# Patient Record
Sex: Female | Born: 1975 | Race: White | Hispanic: No | Marital: Married | Smoking: Former smoker
Health system: Southern US, Community
[De-identification: ages and names within clinical notes are randomized; demographics above are authoritative.]

## PROBLEM LIST (undated history)

## (undated) DIAGNOSIS — T4145XA Adverse effect of unspecified anesthetic, initial encounter: Secondary | ICD-10-CM

## (undated) DIAGNOSIS — M1990 Unspecified osteoarthritis, unspecified site: Secondary | ICD-10-CM

## (undated) DIAGNOSIS — Z3490 Encounter for supervision of normal pregnancy, unspecified, unspecified trimester: Secondary | ICD-10-CM

## (undated) DIAGNOSIS — IMO0002 Reserved for concepts with insufficient information to code with codable children: Secondary | ICD-10-CM

## (undated) DIAGNOSIS — D649 Anemia, unspecified: Secondary | ICD-10-CM

## (undated) DIAGNOSIS — G43909 Migraine, unspecified, not intractable, without status migrainosus: Secondary | ICD-10-CM

## (undated) DIAGNOSIS — B999 Unspecified infectious disease: Secondary | ICD-10-CM

## (undated) DIAGNOSIS — O4402 Placenta previa specified as without hemorrhage, second trimester: Secondary | ICD-10-CM

## (undated) DIAGNOSIS — N83209 Unspecified ovarian cyst, unspecified side: Secondary | ICD-10-CM

## (undated) DIAGNOSIS — T8859XA Other complications of anesthesia, initial encounter: Secondary | ICD-10-CM

## (undated) HISTORY — PX: WRIST SURGERY: SHX841

## (undated) HISTORY — DX: Unspecified osteoarthritis, unspecified site: M19.90

## (undated) HISTORY — PX: KNEE SURGERY: SHX244

## (undated) HISTORY — DX: Unspecified infectious disease: B99.9

## (undated) HISTORY — DX: Encounter for supervision of normal pregnancy, unspecified, unspecified trimester: Z34.90

## (undated) HISTORY — DX: Unspecified ovarian cyst, unspecified side: N83.209

## (undated) HISTORY — DX: Adverse effect of unspecified anesthetic, initial encounter: T41.45XA

## (undated) HISTORY — PX: HERNIA REPAIR: SHX51

## (undated) HISTORY — DX: Reserved for concepts with insufficient information to code with codable children: IMO0002

## (undated) HISTORY — PX: CHOLECYSTECTOMY: SHX55

## (undated) HISTORY — DX: Anemia, unspecified: D64.9

## (undated) HISTORY — DX: Complete placenta previa nos or without hemorrhage, second trimester: O44.02

## (undated) HISTORY — DX: Other complications of anesthesia, initial encounter: T88.59XA

---
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
## 1998-01-04 ENCOUNTER — Emergency Department (HOSPITAL_COMMUNITY): Admission: EM | Admit: 1998-01-04 | Discharge: 1998-01-04 | Payer: Self-pay | Admitting: Emergency Medicine

## 1998-07-30 ENCOUNTER — Emergency Department (HOSPITAL_COMMUNITY): Admission: EM | Admit: 1998-07-30 | Discharge: 1998-07-30 | Payer: Self-pay | Admitting: Emergency Medicine

## 1998-08-06 ENCOUNTER — Emergency Department (HOSPITAL_COMMUNITY): Admission: EM | Admit: 1998-08-06 | Discharge: 1998-08-06 | Payer: Self-pay | Admitting: Emergency Medicine

## 1998-08-06 ENCOUNTER — Encounter: Payer: Self-pay | Admitting: Emergency Medicine

## 1998-09-18 ENCOUNTER — Encounter: Admission: RE | Admit: 1998-09-18 | Discharge: 1998-09-18 | Payer: Self-pay | Admitting: Family Medicine

## 1998-10-02 ENCOUNTER — Encounter: Payer: Self-pay | Admitting: Family Medicine

## 1998-10-02 ENCOUNTER — Ambulatory Visit (HOSPITAL_COMMUNITY): Admission: RE | Admit: 1998-10-02 | Discharge: 1998-10-02 | Payer: Self-pay | Admitting: Family Medicine

## 1999-02-20 ENCOUNTER — Ambulatory Visit (HOSPITAL_BASED_OUTPATIENT_CLINIC_OR_DEPARTMENT_OTHER): Admission: RE | Admit: 1999-02-20 | Discharge: 1999-02-20 | Payer: Self-pay | Admitting: Orthopedic Surgery

## 1999-03-06 ENCOUNTER — Encounter: Payer: Self-pay | Admitting: Emergency Medicine

## 1999-03-06 ENCOUNTER — Emergency Department (HOSPITAL_COMMUNITY): Admission: EM | Admit: 1999-03-06 | Discharge: 1999-03-06 | Payer: Self-pay | Admitting: Emergency Medicine

## 1999-06-23 DIAGNOSIS — R87619 Unspecified abnormal cytological findings in specimens from cervix uteri: Secondary | ICD-10-CM

## 1999-06-23 DIAGNOSIS — IMO0002 Reserved for concepts with insufficient information to code with codable children: Secondary | ICD-10-CM

## 1999-06-23 HISTORY — DX: Reserved for concepts with insufficient information to code with codable children: IMO0002

## 1999-06-23 HISTORY — DX: Unspecified abnormal cytological findings in specimens from cervix uteri: R87.619

## 1999-07-13 ENCOUNTER — Encounter: Payer: Self-pay | Admitting: *Deleted

## 1999-07-13 ENCOUNTER — Emergency Department (HOSPITAL_COMMUNITY): Admission: EM | Admit: 1999-07-13 | Discharge: 1999-07-13 | Payer: Self-pay | Admitting: *Deleted

## 1999-12-13 ENCOUNTER — Inpatient Hospital Stay (HOSPITAL_COMMUNITY): Admission: AD | Admit: 1999-12-13 | Discharge: 1999-12-13 | Payer: Self-pay | Admitting: *Deleted

## 2000-10-08 ENCOUNTER — Emergency Department (HOSPITAL_COMMUNITY): Admission: EM | Admit: 2000-10-08 | Discharge: 2000-10-08 | Payer: Self-pay | Admitting: Emergency Medicine

## 2000-10-08 ENCOUNTER — Encounter: Payer: Self-pay | Admitting: Emergency Medicine

## 2000-10-15 ENCOUNTER — Emergency Department (HOSPITAL_COMMUNITY): Admission: EM | Admit: 2000-10-15 | Discharge: 2000-10-15 | Payer: Self-pay | Admitting: Emergency Medicine

## 2000-10-15 ENCOUNTER — Encounter: Payer: Self-pay | Admitting: Emergency Medicine

## 2001-05-18 ENCOUNTER — Emergency Department (HOSPITAL_COMMUNITY): Admission: EM | Admit: 2001-05-18 | Discharge: 2001-05-18 | Payer: Self-pay

## 2001-06-02 ENCOUNTER — Ambulatory Visit (HOSPITAL_COMMUNITY): Admission: RE | Admit: 2001-06-02 | Discharge: 2001-06-02 | Payer: Self-pay | Admitting: Orthopaedic Surgery

## 2001-06-02 ENCOUNTER — Encounter: Payer: Self-pay | Admitting: Orthopaedic Surgery

## 2001-12-17 ENCOUNTER — Emergency Department (HOSPITAL_COMMUNITY): Admission: EM | Admit: 2001-12-17 | Discharge: 2001-12-17 | Payer: Self-pay

## 2002-02-09 ENCOUNTER — Encounter: Payer: Self-pay | Admitting: Emergency Medicine

## 2002-02-09 ENCOUNTER — Emergency Department (HOSPITAL_COMMUNITY): Admission: EM | Admit: 2002-02-09 | Discharge: 2002-02-09 | Payer: Self-pay | Admitting: Emergency Medicine

## 2002-04-16 ENCOUNTER — Emergency Department (HOSPITAL_COMMUNITY): Admission: EM | Admit: 2002-04-16 | Discharge: 2002-04-16 | Payer: Self-pay

## 2003-06-23 HISTORY — PX: CHOLECYSTECTOMY: SHX55

## 2003-06-23 HISTORY — PX: DILATION AND CURETTAGE OF UTERUS: SHX78

## 2004-05-20 ENCOUNTER — Inpatient Hospital Stay (HOSPITAL_COMMUNITY): Admission: AD | Admit: 2004-05-20 | Discharge: 2004-05-21 | Payer: Self-pay | Admitting: Obstetrics & Gynecology

## 2004-06-20 ENCOUNTER — Ambulatory Visit (HOSPITAL_COMMUNITY): Admission: RE | Admit: 2004-06-20 | Discharge: 2004-06-20 | Payer: Self-pay | Admitting: Obstetrics & Gynecology

## 2004-06-22 DIAGNOSIS — O4402 Placenta previa specified as without hemorrhage, second trimester: Secondary | ICD-10-CM

## 2004-06-22 HISTORY — DX: Complete placenta previa nos or without hemorrhage, second trimester: O44.02

## 2004-09-19 ENCOUNTER — Observation Stay (HOSPITAL_COMMUNITY): Admission: AD | Admit: 2004-09-19 | Discharge: 2004-09-20 | Payer: Self-pay | Admitting: *Deleted

## 2004-10-09 ENCOUNTER — Observation Stay (HOSPITAL_COMMUNITY): Admission: AD | Admit: 2004-10-09 | Discharge: 2004-10-10 | Payer: Self-pay | Admitting: Obstetrics & Gynecology

## 2004-10-11 ENCOUNTER — Inpatient Hospital Stay (HOSPITAL_COMMUNITY): Admission: AD | Admit: 2004-10-11 | Discharge: 2004-10-11 | Payer: Self-pay | Admitting: Obstetrics

## 2004-10-17 ENCOUNTER — Inpatient Hospital Stay (HOSPITAL_COMMUNITY): Admission: AD | Admit: 2004-10-17 | Discharge: 2004-10-17 | Payer: Self-pay | Admitting: Obstetrics & Gynecology

## 2004-10-19 ENCOUNTER — Inpatient Hospital Stay (HOSPITAL_COMMUNITY): Admission: AD | Admit: 2004-10-19 | Discharge: 2004-10-20 | Payer: Self-pay | Admitting: Obstetrics

## 2004-10-21 ENCOUNTER — Inpatient Hospital Stay (HOSPITAL_COMMUNITY): Admission: AD | Admit: 2004-10-21 | Discharge: 2004-10-24 | Payer: Self-pay | Admitting: Obstetrics

## 2004-10-22 ENCOUNTER — Encounter (INDEPENDENT_AMBULATORY_CARE_PROVIDER_SITE_OTHER): Payer: Self-pay | Admitting: *Deleted

## 2004-10-29 ENCOUNTER — Ambulatory Visit (HOSPITAL_COMMUNITY): Admission: RE | Admit: 2004-10-29 | Discharge: 2004-10-29 | Payer: Self-pay | Admitting: Obstetrics & Gynecology

## 2005-03-23 ENCOUNTER — Ambulatory Visit (HOSPITAL_COMMUNITY): Admission: RE | Admit: 2005-03-23 | Discharge: 2005-03-23 | Payer: Self-pay | Admitting: Surgery

## 2005-03-23 ENCOUNTER — Ambulatory Visit (HOSPITAL_BASED_OUTPATIENT_CLINIC_OR_DEPARTMENT_OTHER): Admission: RE | Admit: 2005-03-23 | Discharge: 2005-03-23 | Payer: Self-pay | Admitting: Surgery

## 2005-03-23 ENCOUNTER — Encounter (INDEPENDENT_AMBULATORY_CARE_PROVIDER_SITE_OTHER): Payer: Self-pay | Admitting: Specialist

## 2005-09-03 ENCOUNTER — Encounter
Admission: RE | Admit: 2005-09-03 | Discharge: 2005-10-05 | Payer: Self-pay | Admitting: Physical Medicine & Rehabilitation

## 2005-12-10 ENCOUNTER — Emergency Department (HOSPITAL_COMMUNITY): Admission: EM | Admit: 2005-12-10 | Discharge: 2005-12-10 | Payer: Self-pay | Admitting: Emergency Medicine

## 2006-04-27 ENCOUNTER — Emergency Department (HOSPITAL_COMMUNITY): Admission: EM | Admit: 2006-04-27 | Discharge: 2006-04-27 | Payer: Self-pay | Admitting: Emergency Medicine

## 2006-05-17 ENCOUNTER — Emergency Department (HOSPITAL_COMMUNITY): Admission: EM | Admit: 2006-05-17 | Discharge: 2006-05-18 | Payer: Self-pay | Admitting: Emergency Medicine

## 2006-08-03 ENCOUNTER — Emergency Department (HOSPITAL_COMMUNITY): Admission: EM | Admit: 2006-08-03 | Discharge: 2006-08-03 | Payer: Self-pay | Admitting: Emergency Medicine

## 2006-10-16 ENCOUNTER — Emergency Department (HOSPITAL_COMMUNITY): Admission: EM | Admit: 2006-10-16 | Discharge: 2006-10-16 | Payer: Self-pay | Admitting: *Deleted

## 2007-04-21 ENCOUNTER — Emergency Department (HOSPITAL_COMMUNITY): Admission: EM | Admit: 2007-04-21 | Discharge: 2007-04-21 | Payer: Self-pay | Admitting: Emergency Medicine

## 2008-03-10 ENCOUNTER — Emergency Department (HOSPITAL_COMMUNITY): Admission: EM | Admit: 2008-03-10 | Discharge: 2008-03-10 | Payer: Self-pay | Admitting: Emergency Medicine

## 2008-04-29 ENCOUNTER — Emergency Department (HOSPITAL_COMMUNITY): Admission: EM | Admit: 2008-04-29 | Discharge: 2008-04-29 | Payer: Self-pay | Admitting: Emergency Medicine

## 2008-08-30 ENCOUNTER — Encounter: Admission: RE | Admit: 2008-08-30 | Discharge: 2008-09-20 | Payer: Self-pay | Admitting: Orthopedic Surgery

## 2008-09-19 ENCOUNTER — Emergency Department (HOSPITAL_COMMUNITY): Admission: EM | Admit: 2008-09-19 | Discharge: 2008-09-19 | Payer: Self-pay | Admitting: Family Medicine

## 2009-06-20 ENCOUNTER — Emergency Department (HOSPITAL_COMMUNITY): Admission: EM | Admit: 2009-06-20 | Discharge: 2009-06-20 | Payer: Self-pay | Admitting: Emergency Medicine

## 2009-11-24 ENCOUNTER — Emergency Department (HOSPITAL_BASED_OUTPATIENT_CLINIC_OR_DEPARTMENT_OTHER): Admission: EM | Admit: 2009-11-24 | Discharge: 2009-11-25 | Payer: Self-pay | Admitting: Emergency Medicine

## 2009-11-29 ENCOUNTER — Emergency Department (HOSPITAL_BASED_OUTPATIENT_CLINIC_OR_DEPARTMENT_OTHER): Admission: EM | Admit: 2009-11-29 | Discharge: 2009-11-30 | Payer: Self-pay | Admitting: Emergency Medicine

## 2010-02-02 ENCOUNTER — Emergency Department (HOSPITAL_BASED_OUTPATIENT_CLINIC_OR_DEPARTMENT_OTHER): Admission: EM | Admit: 2010-02-02 | Discharge: 2010-02-03 | Payer: Self-pay | Admitting: Emergency Medicine

## 2010-02-02 ENCOUNTER — Ambulatory Visit: Payer: Self-pay | Admitting: Diagnostic Radiology

## 2010-07-13 ENCOUNTER — Emergency Department (HOSPITAL_BASED_OUTPATIENT_CLINIC_OR_DEPARTMENT_OTHER)
Admission: EM | Admit: 2010-07-13 | Discharge: 2010-07-13 | Payer: Self-pay | Source: Home / Self Care | Admitting: Emergency Medicine

## 2010-09-04 ENCOUNTER — Emergency Department (HOSPITAL_BASED_OUTPATIENT_CLINIC_OR_DEPARTMENT_OTHER)
Admission: EM | Admit: 2010-09-04 | Discharge: 2010-09-04 | Disposition: A | Discharge status: Home / Self Care | Payer: Medicaid Other | Attending: Emergency Medicine | Admitting: Emergency Medicine

## 2010-09-04 DIAGNOSIS — R059 Cough, unspecified: Secondary | ICD-10-CM | POA: Insufficient documentation

## 2010-09-04 DIAGNOSIS — J45909 Unspecified asthma, uncomplicated: Secondary | ICD-10-CM | POA: Insufficient documentation

## 2010-09-04 DIAGNOSIS — R05 Cough: Secondary | ICD-10-CM | POA: Insufficient documentation

## 2010-09-04 DIAGNOSIS — J40 Bronchitis, not specified as acute or chronic: Secondary | ICD-10-CM | POA: Insufficient documentation

## 2010-11-07 NOTE — Discharge Summary (Signed)
NAMEJONICE, CERRA            ACCOUNT NO.:  [id number]   MEDICAL RECORD NO.:  [id number]          PATIENT TYPE:  INP   LOCATION:  9155                          FACILITY:  [facility]   PHYSICIAN:  Charles A. Clearance Coots, M.D.DATE OF BIRTH:  1975/10/26   DATE OF ADMISSION:  10/09/2004  DATE OF DISCHARGE:  10/10/2004                                 DISCHARGE SUMMARY   ADMITTING DIAGNOSES:  1.  Thirty-four weeks gestation  2.  Marginal placental separation.   DISCHARGE DIAGNOSES:  1.  Thirty-three weeks gestation.  2.  Marginal placental separation, much improved after bedrest. Discharged      home undelivered at [redacted] weeks gestation.   REASON FOR ADMISSION:  A 35 year old white female G4 P1-0-2-1 with an  estimated date of confinement of Nov 11, 2004 presents with vaginal  bleeding. The patient had had a previous history of questionable marginal  placenta previa and on follow-up ultrasounds there was no evidence of  placenta previa. The patient presented to [facility] triage with  cramping and vaginal bleeding, was admitted to the hospital for observation.   PAST MEDICAL HISTORY:  Surgery:  Appendectomy, cholecystectomy. Illnesses:  Asthma, headaches.   MEDICATIONS:  Prenatal vitamins, terbutaline, Ambien, and Lunesta.   ALLERGIES:  AUGMENTIN.   SOCIAL HISTORY:  Single. Occasional tobacco. Negative alcohol or  recreational drug use.   PHYSICAL EXAMINATION:  GENERAL:  Well-nourished, well-developed white female  in no acute distress.  VITAL SIGNS:  Temperature 98.2, pulse 80, respiratory rate 16, blood  pressure 128/84. Height 5 feet 0 inches, weight 150 pounds.  LUNGS:  Clear to auscultation bilaterally.  HEART:  Regular rate and rhythm.  ABDOMEN:  Gravid, nontender.  PELVIC:  On speculum exam, moderate amount of blood in vaginal vault. Cervix  appeared long and closed.   ADMITTING LABORATORY VALUES:  Hemoglobin 11; hematocrit 33; white blood cell  count 10,500; platelets  311,000. Fibrinogen 531. INR 1.0.   HOSPITAL COURSE:  The patient was admitted and had no further vaginal  bleeding, was therefore discharged home after 24 hours of observation.   DISCHARGE DISPOSITION:  1.  Medications:  Continue prenatal vitamins.  2.  Routine written instructions were given for obstetrical discharge      undelivered.  3.  The patient is to follow up in the office in 1 week.      CAH/MEDQ  D:  10/10/2004  T:  10/10/2004  Job:  [id number]

## 2010-11-07 NOTE — Op Note (Signed)
NAMESHANEY, DECKMAN            ACCOUNT NO.:  [id number]   MEDICAL RECORD NO.:  [id number]          PATIENT TYPE:  AMB   LOCATION:  DSC                          FACILITY:  [facility]   PHYSICIAN:  Wilmon Arms. Corliss Skains, M.D. DATE OF BIRTH:  27-Nov-1975   DATE OF PROCEDURE:  03/23/2005  DATE OF DISCHARGE:                                 OPERATIVE REPORT   PREOPERATIVE DIAGNOSES:  1.  Anal fistula.  2.  External hemorrhoids.   POSTOPERATIVE DIAGNOSES:  1.  Anal fistula.  2.  External hemorrhoids.   OPERATION PERFORMED:  1.  Anal fistulotomy.  2.  External hemorrhoidectomy.   SURGEON:  Wilmon Arms. Tsuei, M.D.   ANESTHESIA:  General endotracheal.   INDICATIONS FOR PROCEDURE:  The patient is a 35 year old female who  presented to the office a couple of weeks ago with hemorrhoid disease.  She  was noted to have external hemorrhoid.  She also had what appeared to be a  spontaneously drained perirectal abscess at the base of the anterior  hemorrhoid.  This was treated conservatively with sitz baths and stool  softeners. However, this did not resolve the problem and the decision was  made to proceed with surgical intervention.   DESCRIPTION OF PROCEDURE:  The patient was brought to the operating room  where general endotracheal anesthesia was obtained.  She was rotated to a  prone position with appropriate padding.  Her buttocks were taped apart.  The perianal region was prepped with Betadine and draped in sterile fashion.  A time out was then taken to ensure the proper procedure and the proper  patient.  The rectal retractor was then inserted.  A fistula opening was  noted just distal to the dentate line.  A small lacrimal probe was placed  into this fistula opening.  This tracked superiorly approximately 1.5 cm  under the skin and mucosa.  The probe was brought out through the internal  opening and a fistulotomy was then created using a Bovie cautery.  The  sphincter muscle was  visualized and was noted to be intact.  Once the  fistulotomy was completed, a probe was used to explore the remainder of the  abscess cavity. The abscess cavity appeared to track slightly anterior but  did not communicate with the posterior vaginal wall.  Hemostasis was  obtained with Bovie cautery.  This area was thoroughly irrigated.  We then  turned our attention to the external hemorrhoid.  The apex of the hemorrhoid  was ligated with a 3-0 Vicryl suture.  The knife was used to score the skin  and mucosa around the base of the hemorrhoid.  Metzenbaum scissors were then  used to dissect the hemorrhoid off of the sphincter muscle.  Hemostasis was  obtained with Bovie cautery.  The wound was then closed most of the way with  a locking running 3-0 Vicryl suture.  The most distal 0.5 cm was left open  to allow  drainage.  The wound was then thoroughly examined.  No further fistulae were  noted.  A large piece of Gelfoam coated with lidocaine jelly was then  inserted into the rectum and the retractors were removed.  The patient was  rolled back to a supine position, extubated and brought to recovery room in  stable condition.      Wilmon Arms. Tsuei, M.D.  Electronically Signed     MKT/MEDQ  D:  03/23/2005  T:  03/23/2005  Job:  [id number]

## 2011-02-20 ENCOUNTER — Emergency Department (INDEPENDENT_AMBULATORY_CARE_PROVIDER_SITE_OTHER): Payer: Medicaid Other

## 2011-02-20 ENCOUNTER — Encounter: Payer: Self-pay | Admitting: *Deleted

## 2011-02-20 ENCOUNTER — Emergency Department (HOSPITAL_BASED_OUTPATIENT_CLINIC_OR_DEPARTMENT_OTHER)
Admission: EM | Admit: 2011-02-20 | Discharge: 2011-02-20 | Disposition: A | Discharge status: Home / Self Care | Payer: Medicaid Other | Attending: Emergency Medicine | Admitting: Emergency Medicine

## 2011-02-20 DIAGNOSIS — R0789 Other chest pain: Secondary | ICD-10-CM

## 2011-02-20 DIAGNOSIS — F17200 Nicotine dependence, unspecified, uncomplicated: Secondary | ICD-10-CM | POA: Insufficient documentation

## 2011-02-20 DIAGNOSIS — R079 Chest pain, unspecified: Secondary | ICD-10-CM

## 2011-02-20 DIAGNOSIS — D72829 Elevated white blood cell count, unspecified: Secondary | ICD-10-CM

## 2011-02-20 DIAGNOSIS — R6884 Jaw pain: Secondary | ICD-10-CM

## 2011-02-20 HISTORY — DX: Migraine, unspecified, not intractable, without status migrainosus: G43.909

## 2011-02-20 LAB — BASIC METABOLIC PANEL
CO2: 25 mEq/L (ref 19–32)
Calcium: 9 mg/dL (ref 8.4–10.5)
Chloride: 102 mEq/L (ref 96–112)
GFR calc non Af Amer: >60 mL/min (ref >60)

## 2011-02-20 LAB — CBC
HCT: 40.6 % (ref 36.0–46.0)
Hemoglobin: 13 g/dL (ref 12.0–15.0)
MCH: 26.4 pg (ref 26.0–34.0)
MCHC: 32 g/dL (ref 30.0–36.0)
MCV: 82.4 fL (ref 78.0–100.0)
Platelets: 314 10*3/uL (ref 150–400)
RBC: 4.93 MIL/uL (ref 3.87–5.11)
RDW: 14.8 % (ref 11.5–15.5)
WBC: 13.1 10*3/uL — ABNORMAL HIGH (ref 4.0–10.5)

## 2011-02-20 LAB — TROPONIN I: Troponin I: <0.3 ng/mL (ref <0.30)

## 2011-02-20 MED ORDER — DIPHENHYDRAMINE HCL 50 MG/ML IJ SOLN
25.0000 mg | Freq: Once | INTRAMUSCULAR | Status: AC
Start: 2011-02-20 — End: 2011-02-20
  Administered 2011-02-20: 25 mg via INTRAVENOUS
  Filled 2011-02-20: qty 1

## 2011-02-20 MED ORDER — KETOROLAC TROMETHAMINE 30 MG/ML IJ SOLN
30.0000 mg | Freq: Once | INTRAMUSCULAR | Status: AC
  Administered 2011-02-20: 30 mg via INTRAVENOUS
  Filled 2011-02-20: qty 1

## 2011-02-20 MED ORDER — OXYCODONE-ACETAMINOPHEN 5-325 MG PO TABS: 2.0000 | ORAL_TABLET | ORAL | Status: AC | PRN

## 2011-02-20 MED ORDER — METOCLOPRAMIDE HCL 5 MG/ML IJ SOLN
10.0000 mg | Freq: Once | INTRAMUSCULAR | Status: AC
  Administered 2011-02-20: 10 mg via INTRAVENOUS
  Filled 2011-02-20: qty 2

## 2011-02-20 MED ORDER — GI COCKTAIL ~~LOC~~
30.0000 mL | Freq: Once | ORAL | Status: AC
  Administered 2011-02-20: 30 mL via ORAL
  Filled 2011-02-20: qty 30

## 2011-02-20 MED ORDER — LANSOPRAZOLE 15 MG PO CPDR: 15.0000 mg | DELAYED_RELEASE_CAPSULE | Freq: Every day | ORAL | Status: DC

## 2011-02-20 NOTE — ED Notes (Signed)
Pt requesting pain/nausea medicines for her migraine. EDP made aware.

## 2011-02-20 NOTE — ED Notes (Signed)
Pt reports her migraine has decreased to 0/10, however chest/jaw pain remain 8/10. Will notify EDP

## 2011-02-20 NOTE — ED Provider Notes (Addendum)
History     CSN: [id number] Arrival date & time: 02/20/2011 12:19 AM  Chief Complaint  Patient presents with  . Chest Pain   HPI  35 year old female presents to emergency room with complaint of chest pain and jaw pain. Patient reports onset of left-sided pain that radiates to her back and into her right neck and jaw starting on Sunday, 5 days ago. Patient reports pain initially brief lasting a lengthy seconds at a time, then yesterday lasting 25 minutes, and then today throughout the day. Patient reports pain is slightly improved with sticking her tongue out at times. Patient also complaining of her typical migraine, she's been feeling hot without having a fever, she has been tired sleeping most of the last 2 days, and has been nauseated. Patient reports she smokes a quarter pack of cigarettes a day. She has had no swelling in her lower extremities, she is not on birth control pills, has had no prior history of DVT or PE. Patient reports she recently finished course of amoxicillin for right ear infection. No prior history of cardiac disease. Patient is followed by Dr. Piedad Climes. Patient has taken times without relief. She is also taking her Inderal for migraine without relief.  Past Medical History  Diagnosis Date  . Migraines     Past Surgical History  Procedure Date  . Appendectomy   . Cholecystectomy     No family history on file.  History  Substance Use Topics  . Smoking status: Current Everyday Smoker  . Smokeless tobacco: Not on file  . Alcohol Use: Yes     rarely    OB History    Grav Para Term Preterm Abortions TAB SAB Ect Mult Living                  Review of Systems  Review of systems negative other than documented in history of present illness  Physical Exam  BP 122/65  Pulse 72  Temp(Src) 99.2 F (37.3 C) (Oral)  Resp 16  SpO2 98%  LMP 02/05/2011  Physical Exam  Constitutional: She is oriented to person, place, and time. She appears well-developed and  well-nourished.       Obese, uncomfortable appearing  HENT:  Head: Normocephalic and atraumatic.  Right Ear: External ear normal.  Left Ear: External ear normal.  Nose: Nose normal.  Mouth/Throat: Oropharynx is clear and moist.       Slight soft tissue swelling along the right jaw compared to left, tenderness with palpation over right maxilla and mandible. No appreciative dental disease on intraoral exam. No rashes erythema induration  Eyes: Conjunctivae and EOM are normal. Pupils are equal, round, and reactive to light. Right eye exhibits no discharge. No scleral icterus.  Neck: Normal range of motion. Neck supple. No JVD present. No tracheal deviation present. No thyromegaly present.       Patient with pain with palpation over right sternocleidomastoid muscle, no preauricular or postauricular pain noted.  Cardiovascular: Normal rate, regular rhythm, normal heart sounds and intact distal pulses.  Exam reveals no gallop and no friction rub.   No murmur heard. Pulmonary/Chest: Effort normal and breath sounds normal. No stridor. No respiratory distress. She has no wheezes. She has no rales. She exhibits tenderness.       Patient with mild tenderness with palpation of left chest, patient unable to determine if it is the same pain that she presented with today  Abdominal: Soft. Bowel sounds are normal. She exhibits no distension and no  mass. There is no tenderness. There is no rebound and no guarding.  Musculoskeletal: Normal range of motion. She exhibits no edema and no tenderness.  Lymphadenopathy:    She has no cervical adenopathy.  Neurological: She is alert and oriented to person, place, and time. She has normal reflexes. She displays normal reflexes. No cranial nerve deficit. She exhibits normal muscle tone. Coordination normal.  Skin: Skin is warm and dry. No rash noted. No erythema. No pallor.    ED Course  Procedures Results for orders placed during the hospital encounter of 02/20/11    CBC      Component Value Range   WBC 13.1 (*) 4.0 - 10.5 (K/uL)   RBC 4.93  3.87 - 5.11 (MIL/uL)   Hemoglobin 13.0  12.0 - 15.0 (g/dL)   HCT 62.1  30.8 - 65.7 (%)   MCV 82.4  78.0 - 100.0 (fL)   MCH 26.4  26.0 - 34.0 (pg)   MCHC 32.0  30.0 - 36.0 (g/dL)   RDW 84.6  96.2 - 95.2 (%)   Platelets 314  150 - 400 (K/uL)  BASIC METABOLIC PANEL      Component Value Range   Sodium 138  135 - 145 (mEq/L)   Potassium 3.6  3.5 - 5.1 (mEq/L)   Chloride 102  96 - 112 (mEq/L)   CO2 25  19 - 32 (mEq/L)   Glucose, Bld 99  70 - 99 (mg/dL)   BUN 14  6 - 23 (mg/dL)   Creatinine, Ser 8.41  0.50 - 1.10 (mg/dL)   Calcium 9.0  8.4 - 32.4 (mg/dL)   GFR calc non Af Amer >60  >60 (mL/min)   GFR calc Af Amer >60  >60 (mL/min)  D-DIMER, QUANTITATIVE      Component Value Range   D-Dimer, Quant 0.32  0.00 - 0.48 (ug/mL-FEU)  TROPONIN I      Component Value Range   Troponin I <0.30  <0.30 (ng/mL)   Dg Chest 2 View  02/20/2011  *RADIOLOGY REPORT*  Clinical Data: Midsternal chest pain  CHEST - 2 VIEW  Comparison: None.  Findings: Cardiomediastinal silhouette is within normal limits. The lungs are clear. No pleural effusion.  No pneumothorax.  No acute osseous abnormality. Cholecystectomy clips noted.  IMPRESSION: Normal exam.  Original Report Authenticated By: Harrel Lemon, M.D.    Date: 02/20/2011  Rate: 78  Rhythm: normal sinus rhythm  QRS Axis: normal  Intervals: normal  ST/T Wave abnormalities: normal  Conduction Disutrbances:none  Narrative Interpretation: normal ekg  Old EKG Reviewed: none available  MDM 35 year old female with left-sided chest pain and right jaw and neck pain. Differential included cardiac ischemia, pericarditis, aortic dissection, PE, ear infection, mastoiditis. Patient noted to have leukocytosis otherwise workup has been unremarkable. Patient with resolution of migraine with normal cocktail of medications. She reports chest pain improved after GI cocktail. No acute emergent  conditions identified at this time. Will have patient followup with her primary care doctor for further workup of her pain and leukocytosis.       Olivia Mackie, MD 02/20/11 4010  Olivia Mackie, MD 02/20/11 [phone]

## 2011-02-20 NOTE — ED Notes (Signed)
C/o left sided chest pains since last week, radiates into left jaw and straight through into back. Intermittent in nature. Described as sharp and stabbing. +nausea, SOB and diaphoresis

## 2011-03-07 ENCOUNTER — Emergency Department (HOSPITAL_BASED_OUTPATIENT_CLINIC_OR_DEPARTMENT_OTHER)
Admission: EM | Admit: 2011-03-07 | Discharge: 2011-03-07 | Disposition: A | Discharge status: Home / Self Care | Payer: Medicaid Other | Attending: Emergency Medicine | Admitting: Emergency Medicine

## 2011-03-07 ENCOUNTER — Encounter (HOSPITAL_BASED_OUTPATIENT_CLINIC_OR_DEPARTMENT_OTHER): Payer: Self-pay | Admitting: Emergency Medicine

## 2011-03-07 DIAGNOSIS — J45901 Unspecified asthma with (acute) exacerbation: Secondary | ICD-10-CM

## 2011-03-07 DIAGNOSIS — J069 Acute upper respiratory infection, unspecified: Secondary | ICD-10-CM | POA: Insufficient documentation

## 2011-03-07 DIAGNOSIS — F17200 Nicotine dependence, unspecified, uncomplicated: Secondary | ICD-10-CM | POA: Insufficient documentation

## 2011-03-07 MED ORDER — ALBUTEROL SULFATE (5 MG/ML) 0.5% IN NEBU
2.5000 mg | INHALATION_SOLUTION | Freq: Once | RESPIRATORY_TRACT | Status: AC
  Administered 2011-03-07: 2.5 mg via RESPIRATORY_TRACT
  Filled 2011-03-07: qty 1

## 2011-03-07 MED ORDER — DESLORATADINE-PSEUDOEPHED ER 5-240 MG PO TB24: 1.0000 | ORAL_TABLET | Freq: Every day | ORAL | Status: DC

## 2011-03-07 MED ORDER — ALBUTEROL SULFATE HFA 108 (90 BASE) MCG/ACT IN AERS
INHALATION_SPRAY | RESPIRATORY_TRACT | Status: AC
  Administered 2011-03-07: 21:00:00
  Filled 2011-03-07: qty 6.7

## 2011-03-07 MED ORDER — ALBUTEROL SULFATE (5 MG/ML) 0.5% IN NEBU
INHALATION_SOLUTION | RESPIRATORY_TRACT | Status: AC
  Administered 2011-03-07: 5 mg
  Filled 2011-03-07: qty 1

## 2011-03-07 MED ORDER — IPRATROPIUM BROMIDE 0.02 % IN SOLN
0.5000 mg | Freq: Once | RESPIRATORY_TRACT | Status: AC
  Administered 2011-03-07: 0.5 mg via RESPIRATORY_TRACT
  Filled 2011-03-07: qty 2.5

## 2011-03-07 MED ORDER — PREDNISONE 10 MG PO TABS: 40.0000 mg | ORAL_TABLET | Freq: Every day | ORAL | Status: AC

## 2011-03-07 MED ORDER — IPRATROPIUM BROMIDE 0.02 % IN SOLN
RESPIRATORY_TRACT | Status: AC
  Administered 2011-03-07: 0.5 mg
  Filled 2011-03-07: qty 2.5

## 2011-03-07 MED ORDER — ALBUTEROL SULFATE HFA 108 (90 BASE) MCG/ACT IN AERS
2.0000 | INHALATION_SPRAY | RESPIRATORY_TRACT | Status: DC
  Administered 2011-03-07: 2 via RESPIRATORY_TRACT

## 2011-03-07 MED ORDER — PREDNISONE 20 MG PO TABS
60.0000 mg | ORAL_TABLET | Freq: Once | ORAL | Status: AC
  Administered 2011-03-07: 60 mg via ORAL
  Filled 2011-03-07: qty 3

## 2011-03-07 NOTE — ED Provider Notes (Signed)
Scribed for Sarah Bailiff, MD, the patient was seen in room MHT14/MHT14 . This chart was scribed by Ellie Lunch. This patient's care was started at 9:07 PM.   CSN: [id number] Arrival date & time: 03/07/2011  6:39 PM   Chief Complaint  Patient presents with  . Asthma    (Include location/radiation/quality/duration/timing/severity/associated sxs/prior treatment) HPI Sarah Franklin is a 35 y.o. female with a history of asthma presents to the Emergency Department complaining of SOB. Pt reports she began to feel ill one day ago with chest congestion, cough, fever, ear pain, and SOB. Pt reports this morning her sx became progressively worse and she became SOB with associated wheezing. Pt's albuterol inhaler at home was empty and she was unable to treat her SOB.There are no other associated symptoms and no other alleviating or aggravating factors.   HPI ELEMENTS:  Onset: 1 day ago  Timing: constant  Modifying factors: improved with breathing treatment  Context: as above  Associated symptoms: wheezing  Past Medical History  Diagnosis Date  . Migraines   . Asthma      Past Surgical History  Procedure Date  . Appendectomy   . Cholecystectomy     No family history on file.  History  Substance Use Topics  . Smoking status: Current Everyday Smoker  . Smokeless tobacco: Not on file  . Alcohol Use: Yes     rarely   Review of Systems 10 Systems reviewed and are negative for acute change except as noted in the HPI.  Allergies  Mustard; Other; Topamax; Augmentin; and Eggs or egg-derived products  Home Medications   Current Outpatient Rx  Name Route Sig Dispense Refill  . ALBUTEROL SULFATE HFA 108 (90 BASE) MCG/ACT IN AERS Inhalation Inhale 2 puffs into the lungs every 6 (six) hours as needed. Shortness of breath and wheezing     . OVER THE COUNTER MEDICATION Oral Take 2 capsules by mouth 4 (four) times daily. CTS 360 for weightloss     . PROPRANOLOL HCL 80 MG PO TABS Oral Take  80 mg by mouth daily as needed. Migraine     . DESLORATADINE-PSEUDOEPHEDRINE 5-240 MG PO TB24 Oral Take 1 tablet by mouth daily. 30 tablet 0  . LANSOPRAZOLE 15 MG PO CPDR Oral Take 1 capsule (15 mg total) by mouth daily. 20 capsule 0  . PREDNISONE 10 MG PO TABS Oral Take 4 tablets (40 mg total) by mouth daily. 20 tablet 0    Physical Exam    BP 140/83  Pulse 102  Temp(Src) 99.4 F (37.4 C) (Oral)  Resp 20  SpO2 99%  LMP 03/06/2011  Physical Exam  Nursing note and vitals reviewed. Constitutional: She is oriented to person, place, and time. She appears well-developed and well-nourished.  HENT:  Head: Normocephalic and atraumatic.       Fluid behind right ear  Eyes: Conjunctivae and EOM are normal. Pupils are equal, round, and reactive to light.  Neck: Normal range of motion. Neck supple.  Cardiovascular: Regular rhythm and normal heart sounds.   Pulmonary/Chest: Effort normal. She has wheezes (faint).  Abdominal: Soft. Bowel sounds are normal.  Musculoskeletal: Normal range of motion.  Neurological: She is alert and oriented to person, place, and time.  Skin: Skin is warm and dry.  Psychiatric: She has a normal mood and affect.   Procedures  OTHER DATA REVIEWED: Nursing notes, vital signs, and past medical records reviewed.  DIAGNOSTIC STUDIES: Oxygen Saturation is 99% on room air, normal by my interpretation.  ED COURSE / COORDINATION OF CARE: 19:45 EDP at Pt bedside. Pt reports improvement after 1st breathing treatment. Pt still has faint wheezing. 2nd breathing treatment ordered. Plan to discharge with albuterol inhaler conditional on Pt improvement   EDP ordered the following:  Medications  albuterol (PROVENTIL) (5 MG/ML) 0.5% nebulizer solution (5 mg  Given 03/07/11 1850)  ipratropium (ATROVENT) 0.02 % nebulizer solution (0.5 mg  Given 03/07/11 1850)  albuterol (PROVENTIL) (5 MG/ML) 0.5% nebulizer solution 2.5 mg (2.5 mg Nebulization Given 03/07/11 2000)    ipratropium (ATROVENT) 0.02 % nebulizer solution 0.5 mg (0.5 mg Nebulization Given 03/07/11 2000)  predniSONE (DELTASONE) tablet 60 mg (60 mg Oral Given 03/07/11 1959)   9:07 PM Reassessment after 2 nebulized aerosols the patient had improvement of her symptoms. She still has some faint wheezing but is overall improved. She'll be given an albuterol inhaler prior to discharge.  MDM: Patient has an upper respiratory infection which has induced an asthma exacerbation. She has some fluid behind her right ear consistent with a serous otitis media. I will prescribe decongestants, prednisone for her asthma exacerbation, albuterol inhaler for her upon discharge. Patient does have improvement of her symptoms. There is no indication for chest x-ray at this time. Patient provided signs and symptoms for which to return the emergency department. She'll follow up with her primary care physician this coming week.  SCRIBE ATTESTATION: I personally performed the services described in this documentation, which was scribed in my presence. The recorded information has been reviewed and considered.         Sarah Bailiff, MD 03/07/11 2108

## 2011-03-07 NOTE — ED Notes (Signed)
Pt reading Kindle while receiving neb

## 2011-03-07 NOTE — ED Notes (Signed)
Pt c/o resp difficulty since this am; hx of asthma; wheezing noted presently

## 2011-06-23 NOTE — L&D Delivery Note (Signed)
Delivery Note At 6:45 AM a viable female, "  ", was delivered via Vaginal, Spontaneous Delivery (Presentation: Right Occiput Anterior).  APGAR: 9, 9; weight 9 lb 2 oz (4140 g).   Placenta status: Intact, Spontaneous.  Cord: 3 vessels with the following complications: None.  Cord pH: NA Small vaginal inclusion cyst was noted in the musculature of the exposed perineal body.  This was drained and removed prior to repair.  Anesthesia: None  Episiotomy: None Lacerations: 1st degree;Perineal Suture Repair: 3.0 Est. Blood Loss (mL): 200 cc  Mom to postpartum.  Baby to skin to skin.  Nigel Bridgeman 03/14/2012, 7:58 AM

## 2011-08-21 LAB — OB RESULTS CONSOLE RPR: RPR: NONREACTIVE

## 2011-08-21 LAB — OB RESULTS CONSOLE HEPATITIS B SURFACE ANTIGEN: Hepatitis B Surface Ag: NEGATIVE

## 2011-08-21 LAB — OB RESULTS CONSOLE ABO/RH: RH Type: POSITIVE

## 2011-08-21 LAB — OB RESULTS CONSOLE HIV ANTIBODY (ROUTINE TESTING): HIV: NONREACTIVE

## 2011-10-28 ENCOUNTER — Ambulatory Visit (INDEPENDENT_AMBULATORY_CARE_PROVIDER_SITE_OTHER): Payer: Medicaid Other | Admitting: Obstetrics and Gynecology

## 2011-10-28 DIAGNOSIS — Z3480 Encounter for supervision of other normal pregnancy, unspecified trimester: Secondary | ICD-10-CM

## 2011-10-28 DIAGNOSIS — Z331 Pregnant state, incidental: Secondary | ICD-10-CM

## 2011-10-28 NOTE — Progress Notes (Signed)
PT COMPLETED ROI FORM, HAD PREVIOUS PN CARE DONE @  OB/GYN, STATES HAS HAD AN ANATOMY SCAN AND DNWTK SEX OF BABY.  NO PN LABS DONE TODAY.

## 2011-11-13 ENCOUNTER — Encounter: Payer: Self-pay | Admitting: Obstetrics and Gynecology

## 2011-11-13 ENCOUNTER — Ambulatory Visit (INDEPENDENT_AMBULATORY_CARE_PROVIDER_SITE_OTHER): Payer: Medicaid Other | Admitting: Obstetrics and Gynecology

## 2011-11-13 VITALS — BP 110/70 | Ht 67.0 in | Wt 245.5 lb

## 2011-11-13 DIAGNOSIS — J45909 Unspecified asthma, uncomplicated: Secondary | ICD-10-CM

## 2011-11-13 DIAGNOSIS — Z8659 Personal history of other mental and behavioral disorders: Secondary | ICD-10-CM

## 2011-11-13 DIAGNOSIS — M1990 Unspecified osteoarthritis, unspecified site: Secondary | ICD-10-CM | POA: Insufficient documentation

## 2011-11-13 DIAGNOSIS — Z87440 Personal history of urinary (tract) infections: Secondary | ICD-10-CM

## 2011-11-13 DIAGNOSIS — IMO0002 Reserved for concepts with insufficient information to code with codable children: Secondary | ICD-10-CM

## 2011-11-13 DIAGNOSIS — M129 Arthropathy, unspecified: Secondary | ICD-10-CM

## 2011-11-13 DIAGNOSIS — E669 Obesity, unspecified: Secondary | ICD-10-CM

## 2011-11-13 DIAGNOSIS — G43909 Migraine, unspecified, not intractable, without status migrainosus: Secondary | ICD-10-CM

## 2011-11-13 DIAGNOSIS — Z8759 Personal history of other complications of pregnancy, childbirth and the puerperium: Secondary | ICD-10-CM

## 2011-11-13 DIAGNOSIS — Z87448 Personal history of other diseases of urinary system: Secondary | ICD-10-CM

## 2011-11-13 DIAGNOSIS — O09299 Supervision of pregnancy with other poor reproductive or obstetric history, unspecified trimester: Secondary | ICD-10-CM

## 2011-11-13 DIAGNOSIS — Z862 Personal history of diseases of the blood and blood-forming organs and certain disorders involving the immune mechanism: Secondary | ICD-10-CM

## 2011-11-13 DIAGNOSIS — Z8742 Personal history of other diseases of the female genital tract: Secondary | ICD-10-CM

## 2011-11-13 DIAGNOSIS — Z331 Pregnant state, incidental: Secondary | ICD-10-CM

## 2011-11-13 MED ORDER — ZOLPIDEM TARTRATE 10 MG PO TABS: 10.0000 mg | ORAL_TABLET | Freq: Every evening | ORAL | Status: DC | PRN
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
## 2011-11-13 NOTE — Progress Notes (Signed)
Wt was 246 lbs but pt wants me to note it as 245 1/2  Pap not due until Oct 2013 per pt

## 2011-11-15 LAB — URINE CULTURE: Colony Count: 65000

## 2011-11-17 ENCOUNTER — Other Ambulatory Visit: Payer: Self-pay | Admitting: Obstetrics and Gynecology

## 2011-11-17 ENCOUNTER — Telehealth: Payer: Self-pay | Admitting: Obstetrics and Gynecology

## 2011-11-17 DIAGNOSIS — R6251 Failure to thrive (child): Secondary | ICD-10-CM

## 2011-11-17 NOTE — Telephone Encounter (Signed)
Message copied by Mason Jim on Tue Nov 17, 2011 11:58 AM ------      Message from: Malissa Hippo.      Created: Fri Nov 13, 2011  8:03 PM       Please call pt to sched growth [place] (no wgt gain)  and 1hr gtt - ok to do jelly beans,

## 2011-11-17 NOTE — Telephone Encounter (Signed)
Per SL, pt tohave U/S at [facility].  Scheduled 12/09/11.  Also pt do have glucola ASAP and not wait until [facility].  Sched 11/20/11 to acomodate pt's work schedule. Pt agreeable with appts.

## 2011-11-18 LAB — POCT URINALYSIS DIPSTICK
Bilirubin, UA: NEGATIVE
Blood, UA: NEGATIVE
Ketones, UA: NEGATIVE
Protein, UA: NEGATIVE
pH, UA: 7

## 2011-11-20 ENCOUNTER — Other Ambulatory Visit (INDEPENDENT_AMBULATORY_CARE_PROVIDER_SITE_OTHER): Payer: Medicaid Other

## 2011-11-20 DIAGNOSIS — E669 Obesity, unspecified: Secondary | ICD-10-CM

## 2011-11-20 DIAGNOSIS — Z331 Pregnant state, incidental: Secondary | ICD-10-CM

## 2011-11-20 LAB — GLUCOSE, POCT (MANUAL RESULT ENTRY): POC Glucose: 116 mg/dl — AB (ref 70–99)

## 2011-11-30 ENCOUNTER — Encounter: Payer: Self-pay | Admitting: Obstetrics and Gynecology

## 2011-11-30 DIAGNOSIS — R7309 Other abnormal glucose: Secondary | ICD-10-CM | POA: Insufficient documentation

## 2011-11-30 NOTE — Progress Notes (Signed)
Early glucola =135, pt needs to repeat 1hr at 27-28wks, [per N.D. ok to do jelly beans.

## 2011-12-09 ENCOUNTER — Other Ambulatory Visit: Payer: Medicaid Other

## 2011-12-09 ENCOUNTER — Telehealth: Payer: Self-pay

## 2011-12-09 ENCOUNTER — Ambulatory Visit (INDEPENDENT_AMBULATORY_CARE_PROVIDER_SITE_OTHER): Payer: Medicaid Other | Admitting: Obstetrics and Gynecology

## 2011-12-09 ENCOUNTER — Ambulatory Visit (INDEPENDENT_AMBULATORY_CARE_PROVIDER_SITE_OTHER): Payer: Medicaid Other

## 2011-12-09 VITALS — BP 120/62 | Wt 245.0 lb

## 2011-12-09 DIAGNOSIS — Z331 Pregnant state, incidental: Secondary | ICD-10-CM

## 2011-12-09 DIAGNOSIS — R6251 Failure to thrive (child): Secondary | ICD-10-CM

## 2011-12-09 MED ORDER — ZOLPIDEM TARTRATE 10 MG PO TABS: 10.0000 mg | ORAL_TABLET | Freq: Every evening | ORAL | Status: DC | PRN

## 2011-12-09 NOTE — Telephone Encounter (Signed)
Per Corrie Dandy pt needs a 3hr GTT It is noted it pt last visit to repeat a 1hr GTT w/ Jelly Beans per Shelly L and Dr.Dillard. Pt cannot do liquid glucola  Katie called [facility] to see if there is another option for doing a 3 hr GTT other than the liquid drink. The rep Caryl Asp spoke w/ said there are NO other options, and a second rep Florentina Addison spoke to said they will confirm w/ a Doctor and call Katie back. Corrie Dandy stated the pt still needs a 3 hr GTT to be scheduled.

## 2011-12-09 NOTE — Progress Notes (Signed)
Pt has no concerns today. Pt states she was supposed to be prescribed a sleep aid Rx from her last visit. She never received a Rx.  11/13/2011 Note states she will need repeat 1hr at 27-28wks with Jelly beans per Dr.Dillard

## 2011-12-09 NOTE — Progress Notes (Signed)
Patient ID: Sarah Franklin, female   DOB: Jan 21, 1976, 36 y.o.   MRN: [id number] Needs 3 gtt discussed, Reviewed s/s preterm labor, srom, vag bleeding, kick counts to report, enc 8 water daily and frequent voids, has RX ambein ordered discussed. [place] dates agree EFW 63% afi WNL VTX post placenta Lavera Guise, CNM

## 2011-12-10 ENCOUNTER — Telehealth: Payer: Self-pay

## 2011-12-10 DIAGNOSIS — Z331 Pregnant state, incidental: Secondary | ICD-10-CM | POA: Insufficient documentation

## 2011-12-10 LAB — US OB FOLLOW UP

## 2011-12-10 NOTE — Telephone Encounter (Signed)
Spoke with pt. During 12/09/11 visit with [facility] indicated 3 gtt is needed. Per SL note 11/30/11 pt needs rpt 1 gtt not 3 gtt jelly beans ok per ND. Lab only visit is scheduled 12/18/11 @ 10:30 am. Pt agrees and voices understanding.

## 2011-12-16 ENCOUNTER — Other Ambulatory Visit: Payer: Medicaid Other

## 2011-12-18 ENCOUNTER — Other Ambulatory Visit: Payer: Medicaid Other

## 2011-12-18 DIAGNOSIS — Z331 Pregnant state, incidental: Secondary | ICD-10-CM

## 2011-12-19 ENCOUNTER — Encounter: Payer: Self-pay | Admitting: Obstetrics and Gynecology

## 2011-12-19 DIAGNOSIS — D649 Anemia, unspecified: Secondary | ICD-10-CM | POA: Insufficient documentation

## 2011-12-22 ENCOUNTER — Ambulatory Visit (INDEPENDENT_AMBULATORY_CARE_PROVIDER_SITE_OTHER): Payer: Medicaid Other | Admitting: Obstetrics and Gynecology

## 2011-12-22 ENCOUNTER — Encounter: Payer: Self-pay | Admitting: Obstetrics and Gynecology

## 2011-12-22 VITALS — BP 110/62 | Wt 248.0 lb

## 2011-12-22 DIAGNOSIS — Z331 Pregnant state, incidental: Secondary | ICD-10-CM

## 2011-12-22 DIAGNOSIS — Z3490 Encounter for supervision of normal pregnancy, unspecified, unspecified trimester: Secondary | ICD-10-CM

## 2011-12-22 NOTE — Progress Notes (Signed)
Doing well.  Passed glucola last week = 112. No issues.  Received Ambien Rx from [place].

## 2011-12-22 NOTE — Progress Notes (Signed)
No complaints

## 2012-01-06 ENCOUNTER — Encounter: Payer: Self-pay | Admitting: Obstetrics and Gynecology

## 2012-01-06 ENCOUNTER — Ambulatory Visit (INDEPENDENT_AMBULATORY_CARE_PROVIDER_SITE_OTHER): Payer: Medicaid Other | Admitting: Obstetrics and Gynecology

## 2012-01-06 VITALS — BP 112/62 | Wt 249.0 lb

## 2012-01-06 DIAGNOSIS — Z331 Pregnant state, incidental: Secondary | ICD-10-CM

## 2012-01-06 DIAGNOSIS — Z3490 Encounter for supervision of normal pregnancy, unspecified, unspecified trimester: Secondary | ICD-10-CM

## 2012-01-06 NOTE — Progress Notes (Signed)
No complaints. Unable to void.

## 2012-01-06 NOTE — Progress Notes (Signed)
[redacted]w[redacted]d No complaints today. Had stated that the baby had balled up and moved erratically a few nights ago and didn't move for a few minutes after this event. Has had normal kick count since this event. Advised to contact provider if worried re unusual fetal movements of lack of movements ROB: 2 weeks

## 2012-01-21 ENCOUNTER — Encounter: Payer: Self-pay | Admitting: Obstetrics and Gynecology

## 2012-01-21 ENCOUNTER — Ambulatory Visit (INDEPENDENT_AMBULATORY_CARE_PROVIDER_SITE_OTHER): Payer: Medicaid Other | Admitting: Obstetrics and Gynecology

## 2012-01-21 VITALS — BP 110/60 | Wt 251.0 lb

## 2012-01-21 DIAGNOSIS — Z331 Pregnant state, incidental: Secondary | ICD-10-CM

## 2012-01-21 DIAGNOSIS — Z3490 Encounter for supervision of normal pregnancy, unspecified, unspecified trimester: Secondary | ICD-10-CM

## 2012-01-21 NOTE — Progress Notes (Signed)
Patient may want MD for delivery. Will decide.

## 2012-01-21 NOTE — Progress Notes (Signed)
Doing well. Advises she wants non-interventive/unmedicated labor, no IV (unless medically indicated), short stay/early d/c from hospital ASAP after delivery. Issues reviewed.  Discussed GBS testing and possible need for IV, implications for pediatric care. Recommended patient consult/notify pediatricians of plans/desires for early d/c.

## 2012-01-21 NOTE — Progress Notes (Signed)
No concerns today 

## 2012-02-04 ENCOUNTER — Ambulatory Visit (INDEPENDENT_AMBULATORY_CARE_PROVIDER_SITE_OTHER): Payer: Medicaid Other | Admitting: Obstetrics and Gynecology

## 2012-02-04 ENCOUNTER — Encounter: Payer: Self-pay | Admitting: Obstetrics and Gynecology

## 2012-02-04 VITALS — BP 100/66

## 2012-02-04 DIAGNOSIS — Z3490 Encounter for supervision of normal pregnancy, unspecified, unspecified trimester: Secondary | ICD-10-CM

## 2012-02-04 DIAGNOSIS — M549 Dorsalgia, unspecified: Secondary | ICD-10-CM

## 2012-02-04 DIAGNOSIS — Z331 Pregnant state, incidental: Secondary | ICD-10-CM

## 2012-02-04 MED ORDER — ZOLPIDEM TARTRATE 10 MG PO TABS: 10.0000 mg | ORAL_TABLET | Freq: Every evening | ORAL | Status: DC | PRN

## 2012-02-04 MED ORDER — CYCLOBENZAPRINE HCL 10 MG PO TABS: 10.0000 mg | ORAL_TABLET | Freq: Three times a day (TID) | ORAL | Status: AC | PRN

## 2012-02-04 NOTE — Progress Notes (Signed)
[redacted]w[redacted]d C/o severe back pain mostly at night. Flexeril 10mg s po q 8hrly PRN No change vaginal secretions Continues to take Ambien for sleep at night. ROB x 2 weeks

## 2012-02-04 NOTE — Progress Notes (Signed)
Pt declines to be weighted today Pt refuses to void. She brought her own urine in container today.

## 2012-02-18 ENCOUNTER — Encounter: Payer: Self-pay | Admitting: Obstetrics and Gynecology

## 2012-02-18 ENCOUNTER — Ambulatory Visit (INDEPENDENT_AMBULATORY_CARE_PROVIDER_SITE_OTHER): Payer: Medicaid Other | Admitting: Obstetrics and Gynecology

## 2012-02-18 ENCOUNTER — Encounter (INDEPENDENT_AMBULATORY_CARE_PROVIDER_SITE_OTHER): Payer: Self-pay | Admitting: Surgery

## 2012-02-18 ENCOUNTER — Telehealth: Payer: Self-pay | Admitting: Obstetrics and Gynecology

## 2012-02-18 ENCOUNTER — Ambulatory Visit (INDEPENDENT_AMBULATORY_CARE_PROVIDER_SITE_OTHER): Payer: Medicaid Other | Admitting: Surgery

## 2012-02-18 VITALS — BP 120/70 | Wt 257.0 lb

## 2012-02-18 VITALS — BP 156/82 | HR 84 | Temp 97.7°F | Resp 20 | Ht 71.0 in | Wt 256.0 lb

## 2012-02-18 DIAGNOSIS — K649 Unspecified hemorrhoids: Secondary | ICD-10-CM

## 2012-02-18 DIAGNOSIS — IMO0002 Reserved for concepts with insufficient information to code with codable children: Secondary | ICD-10-CM

## 2012-02-18 DIAGNOSIS — O228X9 Other venous complications in pregnancy, unspecified trimester: Secondary | ICD-10-CM

## 2012-02-18 DIAGNOSIS — K645 Perianal venous thrombosis: Secondary | ICD-10-CM | POA: Insufficient documentation

## 2012-02-18 DIAGNOSIS — Z331 Pregnant state, incidental: Secondary | ICD-10-CM

## 2012-02-18 MED ORDER — HYDROCORTISONE ACE-PRAMOXINE 1-1 % RE FOAM: 1.0000 | Freq: Two times a day (BID) | RECTAL | Status: AC

## 2012-02-18 NOTE — Telephone Encounter (Signed)
TC from pt. Informed of appt today. 

## 2012-02-18 NOTE — Telephone Encounter (Signed)
TC to pt. LM to return call. Per VL pt scheduled at [facility] with Dr Gerrit Friends today at 3:30, to arrive at 3:00 for eval of possible thrombosed hemorrhoid.

## 2012-02-18 NOTE — Progress Notes (Signed)
C/O painful hemorrhoids no relief with witch hazel

## 2012-02-18 NOTE — Progress Notes (Signed)
C/O ? Thrombosed hemorrhoid--rx Proctofoam HC, and will refer to [facility] today. Cervix closed, 50%, vtx, -2.

## 2012-02-18 NOTE — Progress Notes (Signed)
General Surgery [facility]  Chief Complaint  Patient presents with  . Hemorrhoids    patient [redacted] weeks pregnant - referral from [place], [place]    HISTORY: Patient is a 36 year old white female who is 76 weeks estimated gestational age with her third pregnancy. 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 Over the past week she has developed evidence of a thrombosed external hemorrhoid with swelling and pain. She has not seen any significant bleeding. She presents today on referral for evaluation.  Past Medical History  Diagnosis Date  . Placenta previa antepartum, second trimester 2006    20-34 WKS  . Abnormal Pap smear 2001    COLPO DONE;LAST PAP 2011 WAS NORMAL  . Ovarian cyst 14 YOA  . Complication of anesthesia     NEED PATCH BEHIND EAR TO KEEP FROM GETTING SICK  . Arthritis     HAS HAD 4 KNEE SURGERY  . Infection     YEAST INFECTION;NOT FREQUENT  . Anemia     WITH PREGNANCY;FeSO4 SUPP IN PAST  . Migraines 8 YOA    INDERAL, TOPAMAX, AMTRIPTYLINE IN PAST  . Asthma     ALBUTEROL AS NEEDED; AROUND FALL/WINTER MONTHS  . Pregnant      Current Outpatient Prescriptions  Medication Sig Dispense Refill  . albuterol (PROVENTIL HFA;VENTOLIN HFA) 108 (90 BASE) MCG/ACT inhaler Inhale 2 puffs into the lungs every 6 (six) hours as needed. Shortness of breath and wheezing       . cyclobenzaprine (FLEXERIL) 10 MG tablet Take 1 tablet (10 mg total) by mouth every 8 (eight) hours as needed for muscle spasms.  30 tablet  1  . hydrocortisone-pramoxine (PROCTOFOAM HC) rectal foam Place 1 applicator rectally 2 (two) times daily.  10 g  2  . OVER THE COUNTER MEDICATION Take 2 capsules by mouth 4 (four) times daily. CTS 360 for weightloss       . Prenatal Vit-Fe Psac Cmplx-FA (SELECT-OB) 29-1 MG CHEW Chew 1 tablet by mouth at bedtime.      Marland Kitchen zolpidem (AMBIEN) 10 MG tablet Take 1 tablet (10 mg total) by mouth at bedtime as  needed.  30 tablet  0  . DISCONTD: zolpidem (AMBIEN) 10 MG tablet Take 1 tablet (10 mg total) by mouth at bedtime as needed for sleep.  30 tablet  0     Allergies  Allergen Reactions  . Mustard (Allyl Isothiocyanate) Anaphylaxis  . Other Anaphylaxis    Oranges Reaction:   . Topamax Hives and Shortness Of Breath  . Augmentin (Amoxicillin-Pot Clavulanate) Nausea And Vomiting  . Eggs Or Egg-Derived Products Nausea And Vomiting    GI Upset     Family History  Problem Relation Age of Onset  . Osteoporosis Mother   . Anemia Mother   . Depression Mother   . Osteoporosis Maternal Grandmother      History   Social History  . Marital Status: Married    Spouse Name: [name]    Number of Children: 2  . Years of Education: 16   Occupational History  . STUDENT    Social History Main Topics  . Smoking status: Former Smoker    Quit date: 01/21/2011  . Smokeless tobacco: Never Used  . Alcohol Use: No     rarely  . Drug Use: No  . Sexually Active: Yes -- Female partner(s)    Birth Control/ Protection: None     MIRENA WAS IMBEDDED IN  CERVIX 2007;WAS NOT SURGICALLY REMOVED   Other Topics Concern  . None   Social History Narrative  . None     REVIEW OF SYSTEMS - PERTINENT POSITIVES ONLY: No bleeding. Moderate swelling. Moderate pain.  EXAM: Filed Vitals:   02/18/12 1536  BP: 156/82  Pulse: 84  Temp: 97.7 F (36.5 C)  Resp: 20    HEENT: normocephalic; pupils equal and reactive; sclerae clear; dentition good; mucous membranes moist GU:  External examination shows an obvious thrombosed external hemorrhoid in the right anal verge with moderate soft tissue edema; no ulceration; moderate tenderness; no bleeding EXT:  non-tender without edema; no deformity NEURO: no gross focal deficits; no sign of tremor  PROCEDURE: under aseptic conditions using local anesthetic, an incision is made at the right anal verge. Using a hemostat moderate volume of acute thrombus is  removed. The ectatic vein is also excised. Dry gauze dressing is placed. Patient tolerated the procedure well.  LABORATORY RESULTS: See [facility] ([facility]Epic) for most recent results   RADIOLOGY RESULTS: See [facility] (CHL-Epic) for most recent results   IMPRESSION: Thrombosed external hemorrhoid  PLAN: Usual instructions for local wound care given. Patient will return as needed.  Velora Heckler, MD, FACS General & Endocrine Surgery [facility]   Visit Diagnoses: 1. Hemorrhoids, external, thrombosed     Primary Care Physician: [name], [place]

## 2012-02-18 NOTE — Telephone Encounter (Signed)
Message copied by Mason Jim on Thu Feb 18, 2012 11:40 AM ------      Message from: Cornelius Moras      Created: Thu Feb 18, 2012  9:11 AM      Regarding: Referral       See note in chart and order regarding referral to [facility].            VL

## 2012-02-18 NOTE — Patient Instructions (Signed)
ANORECTAL PROCEDURES: 1.  Tub soaks 2-3 times daily in warm water (may add Epsom salts if desired) 2.  Stool softener for one month (store brand Miralax or Colace) 3.  Avoid toilet paper - use baby wipes or Tucks pads 4.  Increase water intake - 6-8 glasses daily 5.  Apply dry pad to area until drainage stops 

## 2012-02-26 ENCOUNTER — Ambulatory Visit (INDEPENDENT_AMBULATORY_CARE_PROVIDER_SITE_OTHER): Payer: Medicaid Other

## 2012-02-26 VITALS — BP 110/80 | Wt 257.0 lb

## 2012-02-26 NOTE — Progress Notes (Signed)
Pt states no concerns today.   

## 2012-02-26 NOTE — Progress Notes (Signed)
[redacted]w[redacted]d; declines cervical exam.  H/o rapid labor.  GFM.  Rev'd [facility] and labor s/s. Pt reports hemorrhoidectomy recently and has used Roxicet for pain, but reports improvement. Online classes at [employer].  Desires early d/c after delivery.

## 2012-02-28 DIAGNOSIS — IMO0001 Reserved for inherently not codable concepts without codable children: Secondary | ICD-10-CM

## 2012-02-28 HISTORY — DX: Reserved for inherently not codable concepts without codable children: IMO0001

## 2012-03-02 ENCOUNTER — Encounter: Payer: Self-pay | Admitting: Obstetrics and Gynecology

## 2012-03-02 ENCOUNTER — Ambulatory Visit (INDEPENDENT_AMBULATORY_CARE_PROVIDER_SITE_OTHER): Payer: Medicaid Other | Admitting: Obstetrics and Gynecology

## 2012-03-02 VITALS — BP 118/64 | Wt 261.0 lb

## 2012-03-02 DIAGNOSIS — Z3490 Encounter for supervision of normal pregnancy, unspecified, unspecified trimester: Secondary | ICD-10-CM

## 2012-03-02 DIAGNOSIS — Z331 Pregnant state, incidental: Secondary | ICD-10-CM

## 2012-03-02 NOTE — Progress Notes (Signed)
Hemorrhoid much improved after surgery. Reviewed GBS negative status. Wants non-interventive birth, expects early discharge.

## 2012-03-02 NOTE — Progress Notes (Signed)
[redacted]w[redacted]d. Unable to void Request cervix check.

## 2012-03-10 ENCOUNTER — Inpatient Hospital Stay (HOSPITAL_COMMUNITY)
Admission: AD | Admit: 2012-03-10 | Discharge: 2012-03-11 | Disposition: A | Discharge status: Home / Self Care | Payer: Medicaid Other | Source: Ambulatory Visit | Attending: Obstetrics and Gynecology | Admitting: Obstetrics and Gynecology

## 2012-03-10 ENCOUNTER — Telehealth: Payer: Self-pay | Admitting: Obstetrics and Gynecology

## 2012-03-10 ENCOUNTER — Encounter (HOSPITAL_COMMUNITY): Payer: Self-pay | Admitting: *Deleted

## 2012-03-10 DIAGNOSIS — Z331 Pregnant state, incidental: Secondary | ICD-10-CM

## 2012-03-10 DIAGNOSIS — Z8659 Personal history of other mental and behavioral disorders: Secondary | ICD-10-CM

## 2012-03-10 DIAGNOSIS — M1990 Unspecified osteoarthritis, unspecified site: Secondary | ICD-10-CM

## 2012-03-10 DIAGNOSIS — O99891 Other specified diseases and conditions complicating pregnancy: Secondary | ICD-10-CM | POA: Insufficient documentation

## 2012-03-10 DIAGNOSIS — E669 Obesity, unspecified: Secondary | ICD-10-CM

## 2012-03-10 DIAGNOSIS — Z87448 Personal history of other diseases of urinary system: Secondary | ICD-10-CM

## 2012-03-10 DIAGNOSIS — Z8759 Personal history of other complications of pregnancy, childbirth and the puerperium: Secondary | ICD-10-CM

## 2012-03-10 DIAGNOSIS — Z862 Personal history of diseases of the blood and blood-forming organs and certain disorders involving the immune mechanism: Secondary | ICD-10-CM

## 2012-03-10 DIAGNOSIS — O479 False labor, unspecified: Secondary | ICD-10-CM | POA: Insufficient documentation

## 2012-03-10 DIAGNOSIS — O09299 Supervision of pregnancy with other poor reproductive or obstetric history, unspecified trimester: Secondary | ICD-10-CM

## 2012-03-10 DIAGNOSIS — IMO0002 Reserved for concepts with insufficient information to code with codable children: Secondary | ICD-10-CM

## 2012-03-10 DIAGNOSIS — D649 Anemia, unspecified: Secondary | ICD-10-CM

## 2012-03-10 DIAGNOSIS — R7309 Other abnormal glucose: Secondary | ICD-10-CM

## 2012-03-10 NOTE — MAU Note (Signed)
PT SAYS  AT 2230- SHE WAS IN TUB- WHEN SHE GOT    SHE FELT SPOTS OF FLUID.  SAYS SWHEN SHE HAS UC - FEELS FLUID COME OUT .     2 WEEKS AGO IN  OFFICE - 1 CM.    DENIES HSV AND MRSA.

## 2012-03-10 NOTE — Telephone Encounter (Signed)
TC from pt. States has had contractions q 5 min x 1 hr.  Is able to talk and drive through them. +FM  NO vaginal leakage. To maintain fluid intake. Call if contractions closer, stronger, decreased FM or bleeding or vaginal leakage. Pt verbalizes comprehension.

## 2012-03-11 ENCOUNTER — Ambulatory Visit (INDEPENDENT_AMBULATORY_CARE_PROVIDER_SITE_OTHER): Payer: Medicaid Other | Admitting: Obstetrics and Gynecology

## 2012-03-11 VITALS — BP 122/62 | Wt 262.0 lb

## 2012-03-11 DIAGNOSIS — O479 False labor, unspecified: Secondary | ICD-10-CM

## 2012-03-11 DIAGNOSIS — Z3490 Encounter for supervision of normal pregnancy, unspecified, unspecified trimester: Secondary | ICD-10-CM

## 2012-03-11 DIAGNOSIS — Z331 Pregnant state, incidental: Secondary | ICD-10-CM

## 2012-03-11 LAB — AMNISURE RUPTURE OF MEMBRANE (ROM) NOT AT ARMC: Amnisure ROM: NEGATIVE

## 2012-03-11 NOTE — Progress Notes (Signed)
Seen at [facility] through night for contractions, ? Leaking.  Cervix 1-2, high, posterior. Still has some cramping, less today. Cervix 2 cm, 50%, vtx -2, membranes swept. Hopes for labor this week.

## 2012-03-11 NOTE — Progress Notes (Signed)
[redacted]w[redacted]d Declines cervix check.  Was seen at [facility] last with Contractions.

## 2012-03-11 NOTE — MAU Provider Note (Signed)
History     CSN: [id number]  Arrival date and time: 03/10/12 2319   First Provider Initiated Contact with Patient 03/11/12 0017      No chief complaint on file.  HPI Comments: Pt is a [id number] at [redacted]w[redacted]d arrives w c/o ?ROM, and irregular ctx, denies any VB, +FM.  Pt states she had sm round spot of wetness on her pad that happened a couple times before arriving, has been having on and off ctx for several days      Past Medical History  Diagnosis Date  . Placenta previa antepartum, second trimester 2006    20-34 WKS  . Abnormal Pap smear 2001    COLPO DONE;LAST PAP 2011 WAS NORMAL  . Ovarian cyst 12 YOA  . Complication of anesthesia     NEED PATCH BEHIND EAR TO KEEP FROM GETTING SICK  . Arthritis     HAS HAD 4 KNEE SURGERY  . Infection     YEAST INFECTION;NOT FREQUENT  . Anemia     WITH PREGNANCY;FeSO4 SUPP IN PAST  . Migraines 8 YOA    INDERAL, TOPAMAX, AMTRIPTYLINE IN PAST  . Asthma     ALBUTEROL AS NEEDED; AROUND FALL/WINTER MONTHS  . Pregnant   . h/o rapid labor 02/28/2012    Previous labors of 4hrs and 1hr    Past Surgical History  Procedure Date  . Cholecystectomy   . Appendectomy 1991  . Wisdom tooth extraction 1997    ALL 4 EXTRACTED  . Dilation and curettage of uterus 2005  . Knee surgery 2001,2007,2009,2011  . Cholecystectomy 2005    Family History  Problem Relation Age of Onset  . Osteoporosis Mother   . Anemia Mother   . Depression Mother   . Osteoporosis Maternal Grandmother     History  Substance Use Topics  . Smoking status: Former Smoker    Quit date: 01/21/2011  . Smokeless tobacco: Never Used  . Alcohol Use: No     rarely    Allergies:  Allergies  Allergen Reactions  . Mustard (Allyl Isothiocyanate) Anaphylaxis  . Other Anaphylaxis    Oranges Reaction:   . Topamax Hives and Shortness Of Breath  . Augmentin (Amoxicillin-Pot Clavulanate) Nausea And Vomiting  . Eggs Or Egg-Derived Products Nausea And Vomiting    GI Upset     Prescriptions prior to admission  Medication Sig Dispense Refill  . cyclobenzaprine (FLEXERIL) 10 MG tablet Take 1 tablet (10 mg total) by mouth every 8 (eight) hours as needed for muscle spasms.  30 tablet  1  . oxyCODONE (OXY IR/ROXICODONE) 5 MG immediate release tablet Take 5 mg by mouth every 4 (four) hours as needed.      . Prenatal Vit-Fe Psac Cmplx-FA (SELECT-OB) 29-1 MG CHEW Chew 1 tablet by mouth at bedtime.      Marland Kitchen zolpidem (AMBIEN) 10 MG tablet Take 1 tablet (10 mg total) by mouth at bedtime as needed.  30 tablet  0  . albuterol (PROVENTIL HFA;VENTOLIN HFA) 108 (90 BASE) MCG/ACT inhaler Inhale 2 puffs into the lungs every 6 (six) hours as needed. Shortness of breath and wheezing       . OVER THE COUNTER MEDICATION Take 2 capsules by mouth 4 (four) times daily. CTS 360 for weightloss         Review of Systems  Musculoskeletal: Positive for back pain.  Psychiatric/Behavioral: The patient is nervous/anxious.   All other systems reviewed and are negative.   Physical Exam   Blood pressure 139/72,  pulse 72, last menstrual period 06/12/2011.  Physical Exam  Nursing note and vitals reviewed. Constitutional: She is oriented to person, place, and time. She appears well-developed and well-nourished.       obese  HENT:  Head: Normocephalic.  Neck: Normal range of motion.  Cardiovascular: Normal rate, regular rhythm and normal heart sounds.   Respiratory: Effort normal and breath sounds normal.  GI: Soft. Bowel sounds are normal.  Genitourinary: Vagina normal.       cx 1-2cm/th/high, posterior Perineum dry, some d/c   Musculoskeletal: Normal range of motion. She exhibits no edema.  Neurological: She is alert and oriented to person, place, and time.  Skin: Skin is warm and dry.  Psychiatric: She has a normal mood and affect. Her behavior is normal.   FHR 150 reactive, cat 1 toco - rare   MAU Course  Procedures    Assessment and Plan  IUP at 39wks FHR  reassuring amnisure neg Not in labor  D/c home [facility] Labor sx's Keep office appt   Sarah Franklin M 03/11/2012, 1:12 AM

## 2012-03-13 ENCOUNTER — Inpatient Hospital Stay (HOSPITAL_COMMUNITY)
Admission: AD | Admit: 2012-03-13 | Discharge: 2012-03-15 | DRG: 775 | Disposition: A | Discharge status: Home Health | Payer: Medicaid Other | Source: Ambulatory Visit | Attending: Obstetrics and Gynecology | Admitting: Obstetrics and Gynecology

## 2012-03-13 ENCOUNTER — Encounter (HOSPITAL_COMMUNITY): Payer: Self-pay | Admitting: Obstetrics and Gynecology

## 2012-03-13 ENCOUNTER — Telehealth: Payer: Self-pay | Admitting: Obstetrics and Gynecology

## 2012-03-13 DIAGNOSIS — O09299 Supervision of pregnancy with other poor reproductive or obstetric history, unspecified trimester: Secondary | ICD-10-CM

## 2012-03-13 DIAGNOSIS — IMO0002 Reserved for concepts with insufficient information to code with codable children: Secondary | ICD-10-CM | POA: Diagnosis not present

## 2012-03-13 DIAGNOSIS — D281 Benign neoplasm of vagina: Secondary | ICD-10-CM | POA: Diagnosis present

## 2012-03-13 DIAGNOSIS — M1990 Unspecified osteoarthritis, unspecified site: Secondary | ICD-10-CM

## 2012-03-13 DIAGNOSIS — Z331 Pregnant state, incidental: Secondary | ICD-10-CM

## 2012-03-13 DIAGNOSIS — R7309 Other abnormal glucose: Secondary | ICD-10-CM

## 2012-03-13 DIAGNOSIS — E669 Obesity, unspecified: Secondary | ICD-10-CM

## 2012-03-13 DIAGNOSIS — D649 Anemia, unspecified: Secondary | ICD-10-CM

## 2012-03-13 DIAGNOSIS — Z862 Personal history of diseases of the blood and blood-forming organs and certain disorders involving the immune mechanism: Secondary | ICD-10-CM

## 2012-03-13 DIAGNOSIS — Z8759 Personal history of other complications of pregnancy, childbirth and the puerperium: Secondary | ICD-10-CM

## 2012-03-13 DIAGNOSIS — Z8659 Personal history of other mental and behavioral disorders: Secondary | ICD-10-CM

## 2012-03-13 DIAGNOSIS — O4290 Premature rupture of membranes, unspecified as to length of time between rupture and onset of labor, unspecified weeks of gestation: Secondary | ICD-10-CM | POA: Diagnosis present

## 2012-03-13 DIAGNOSIS — Z87448 Personal history of other diseases of urinary system: Secondary | ICD-10-CM

## 2012-03-13 DIAGNOSIS — O99892 Other specified diseases and conditions complicating childbirth: Secondary | ICD-10-CM | POA: Diagnosis present

## 2012-03-13 MED ORDER — OXYCODONE-ACETAMINOPHEN 5-325 MG PO TABS
1.0000 | ORAL_TABLET | ORAL | Status: DC | PRN
  Administered 2012-03-14: 1 via ORAL
  Filled 2012-03-13: qty 2

## 2012-03-13 MED ORDER — CITRIC ACID-SODIUM CITRATE 334-500 MG/5ML PO SOLN: 30.0000 mL | ORAL | Status: DC | PRN

## 2012-03-13 MED ORDER — ACETAMINOPHEN 325 MG PO TABS: 650.0000 mg | ORAL_TABLET | ORAL | Status: DC | PRN

## 2012-03-13 MED ORDER — IBUPROFEN 600 MG PO TABS: 600.0000 mg | ORAL_TABLET | Freq: Four times a day (QID) | ORAL | Status: DC | PRN

## 2012-03-13 MED ORDER — ONDANSETRON HCL 4 MG/2ML IJ SOLN: 4.0000 mg | Freq: Four times a day (QID) | INTRAMUSCULAR | Status: DC | PRN

## 2012-03-13 MED ORDER — FLEET ENEMA 7-19 GM/118ML RE ENEM: 1.0000 | ENEMA | RECTAL | Status: DC | PRN

## 2012-03-13 MED ORDER — LIDOCAINE HCL (PF) 1 % IJ SOLN
30.0000 mL | INTRAMUSCULAR | Status: DC | PRN
  Administered 2012-03-14: 30 mL via SUBCUTANEOUS
  Filled 2012-03-13: qty 30

## 2012-03-13 NOTE — MAU Note (Signed)
Pt reports her water broke about 30 min ago. Having ctx off and on. Reports good fetal movment
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
## 2012-03-13 NOTE — Telephone Encounter (Signed)
Come to [facility] now for evaluation. Lavera Guise, CNM

## 2012-03-13 NOTE — Progress Notes (Signed)
Pt was advised and educated about being on a clear liquid diet. Pt cont to eat solid food. Pt educated again.

## 2012-03-14 ENCOUNTER — Encounter (HOSPITAL_COMMUNITY): Payer: Self-pay | Admitting: *Deleted

## 2012-03-14 DIAGNOSIS — O4290 Premature rupture of membranes, unspecified as to length of time between rupture and onset of labor, unspecified weeks of gestation: Secondary | ICD-10-CM | POA: Diagnosis present

## 2012-03-14 DIAGNOSIS — IMO0002 Reserved for concepts with insufficient information to code with codable children: Secondary | ICD-10-CM | POA: Diagnosis not present

## 2012-03-14 MED ORDER — IBUPROFEN 600 MG PO TABS
600.0000 mg | ORAL_TABLET | Freq: Four times a day (QID) | ORAL | Status: DC
  Filled 2012-03-14: qty 1

## 2012-03-14 MED ORDER — ONDANSETRON HCL 4 MG PO TABS: 4.0000 mg | ORAL_TABLET | ORAL | Status: DC | PRN

## 2012-03-14 MED ORDER — TETANUS-DIPHTH-ACELL PERTUSSIS 5-2.5-18.5 LF-MCG/0.5 IM SUSP: 0.5000 mL | Freq: Once | INTRAMUSCULAR | Status: DC

## 2012-03-14 MED ORDER — LANOLIN HYDROUS EX OINT: TOPICAL_OINTMENT | CUTANEOUS | Status: DC | PRN

## 2012-03-14 MED ORDER — PRENATAL MULTIVITAMIN CH: 1.0000 | ORAL_TABLET | Freq: Every day | ORAL | Status: DC

## 2012-03-14 MED ORDER — OXYCODONE-ACETAMINOPHEN 5-325 MG PO TABS
1.0000 | ORAL_TABLET | ORAL | Status: DC | PRN
  Administered 2012-03-14 – 2012-03-15 (×3): 1 via ORAL
  Filled 2012-03-14 (×3): qty 1

## 2012-03-14 MED ORDER — BENZOCAINE-MENTHOL 20-0.5 % EX AERO
1.0000 "application " | INHALATION_SPRAY | CUTANEOUS | Status: DC | PRN
  Filled 2012-03-14 (×2): qty 56

## 2012-03-14 MED ORDER — WITCH HAZEL-GLYCERIN EX PADS: 1.0000 "application " | MEDICATED_PAD | CUTANEOUS | Status: DC | PRN

## 2012-03-14 MED ORDER — DIPHENHYDRAMINE HCL 25 MG PO CAPS: 25.0000 mg | ORAL_CAPSULE | Freq: Four times a day (QID) | ORAL | Status: DC | PRN

## 2012-03-14 MED ORDER — SIMETHICONE 80 MG PO CHEW: 80.0000 mg | CHEWABLE_TABLET | ORAL | Status: DC | PRN

## 2012-03-14 MED ORDER — OXYTOCIN 10 UNIT/ML IJ SOLN
INTRAMUSCULAR | Status: AC
  Filled 2012-03-14: qty 2

## 2012-03-14 MED ORDER — COMPLETENATE 29-1 MG PO CHEW
1.0000 | CHEWABLE_TABLET | Freq: Every day | ORAL | Status: DC
  Administered 2012-03-14: 1 via ORAL
  Filled 2012-03-14 (×2): qty 1

## 2012-03-14 MED ORDER — SENNOSIDES-DOCUSATE SODIUM 8.6-50 MG PO TABS: 2.0000 | ORAL_TABLET | Freq: Every day | ORAL | Status: DC

## 2012-03-14 MED ORDER — ZOLPIDEM TARTRATE 5 MG PO TABS: 5.0000 mg | ORAL_TABLET | Freq: Every evening | ORAL | Status: DC | PRN

## 2012-03-14 MED ORDER — DIBUCAINE 1 % RE OINT: 1.0000 "application " | TOPICAL_OINTMENT | RECTAL | Status: DC | PRN

## 2012-03-14 MED ORDER — ONDANSETRON HCL 4 MG/2ML IJ SOLN: 4.0000 mg | INTRAMUSCULAR | Status: DC | PRN

## 2012-03-14 NOTE — Progress Notes (Addendum)
  Subjective: Much more uncomfortable now--crying with contractions. Family at bedside, very supportive.  Objective: BP 139/74  Pulse 77  Temp 98.4 F (36.9 C) (Axillary)  Resp 20  Ht 5\' 11"  (1.803 m)  Wt 261 lb (118.389 kg)  BMI 36.40 kg/m2  LMP 06/12/2011      FHT:  Category 1 on tracing now. UC:   regular, every 3-4 minutes SVE:   Dilation: 6.5 Effacement (%): 70;80 Station: -2 Exam by:: v. Eydan Chianese cnm Cervix still posterior, but very stretchy--external os 7, internal os 6 cm.   Assessment / Plan: Progressive labor Will observe at present. Support to patient for labor status.  Nigel Bridgeman 03/14/2012, 5:35 AM

## 2012-03-14 NOTE — Progress Notes (Signed)
  Subjective: Playing cards with family.  Reports UCs are stronger, but appears in NAD.  Objective: BP 148/75  Pulse 77  Temp 98.5 F (36.9 C) (Axillary)  Resp 18  Ht 5\' 11"  (1.803 m)  Wt 261 lb (118.389 kg)  BMI 36.40 kg/m2  LMP 06/12/2011      FHT:  Reassuring on intermittent auscultation UC:   q 3-5 min per patient report  Assessment / Plan: SROM at 5pm Early labor Negative GBS Will CTO for advancing labor.  Nigel Bridgeman 03/14/2012, 12:58 AM

## 2012-03-14 NOTE — Progress Notes (Signed)
  Subjective: Feeling pressure--crying with contractions.  Objective: BP 139/74  Pulse 77  Temp 98.4 F (36.9 C) (Axillary)  Resp 20  Ht 5\' 11"  (1.803 m)  Wt 261 lb (118.389 kg)  BMI 36.40 kg/m2  LMP 06/12/2011      FHT:  Category 1 UC:   regular, every 3-4 minutes SVE:   Dilation: 7.5 Effacement (%): 70;80 Station: -2 Exam by:: v. Soliana Kitko cnm Cervix still posterior, but soft   Assessment / Plan: Will CTO--will defer pain medication at present.  Chimere Klingensmith 03/14/2012, 6:04 AM

## 2012-03-14 NOTE — Progress Notes (Signed)
  Subjective: Reports contractions stronger--still sitting up playing cards.  Objective: BP 124/67  Pulse 83  Temp 98.5 F (36.9 C) (Axillary)  Resp 18  Ht 5\' 11"  (1.803 m)  Wt 261 lb (118.389 kg)  BMI 36.40 kg/m2  LMP 06/12/2011      FHT:  Reactive on intermittent tracing UC:   q 3-5 min, moderate   Assessment / Plan: SROM at 5p, early labor Will CTO at present. Declines VE and augmentation.  Laiyla Slagel 03/14/2012, 2:19 AM

## 2012-03-14 NOTE — Progress Notes (Signed)
  Subjective: Back from tub/shower use.  Reports contractions are still stronger than before.  Objective: BP 124/67  Pulse 83  Temp 97.5 F (36.4 C) (Oral)  Resp 18  Ht 5\' 11"  (1.803 m)  Wt 261 lb (118.389 kg)  BMI 36.40 kg/m2  LMP 06/12/2011      FHT:  Reassuring per intermittent auscultation UC:   q 3-4 min VE deferred at present--advised patient I would recommend evaluation of cervix at least by 6:30am.  If no change, will recommend augmentation.  Assessment / Plan: SROM since 5pm, early labor. Evaluate by 6:30am for status.  Nigel Bridgeman 03/14/2012, 4:15 AM

## 2012-03-14 NOTE — Progress Notes (Signed)
  Subjective: More pain with contractions--had to stop playing cards.  Objective: BP 124/67  Pulse 83  Temp 98.5 F (36.9 C) (Axillary)  Resp 18  Ht 5\' 11"  (1.803 m)  Wt 261 lb (118.389 kg)  BMI 36.40 kg/m2  LMP 06/12/2011      FHT: Category 1 UC:   q 3-5 min  SVE:   Dilation: 4.5 Effacement (%): 70;80 Station: -2 Exam by:: v. Shauntavia Brackin Cervix still posterior.   Assessment / Plan: Increased contractions Recommended use of shower or tub for comfort. Declines pain medication or augmentation.  Nigel Bridgeman 03/14/2012, 2:41 AM

## 2012-03-15 LAB — CBC
HCT: 30.7 % — ABNORMAL LOW (ref 36.0–46.0)
Hemoglobin: 9.3 g/dL — ABNORMAL LOW (ref 12.0–15.0)
MCH: 24.7 pg — ABNORMAL LOW (ref 26.0–34.0)
MCV: 81.6 fL (ref 78.0–100.0)
RBC: 3.76 MIL/uL — ABNORMAL LOW (ref 3.87–5.11)

## 2012-03-15 MED ORDER — HYDROCODONE-ACETAMINOPHEN 7.5-750 MG PO TABS: 1.0000 | ORAL_TABLET | Freq: Four times a day (QID) | ORAL | Status: DC | PRN

## 2012-03-15 MED ORDER — OXYCODONE-ACETAMINOPHEN 5-325 MG PO TABS: 2.0000 | ORAL_TABLET | Freq: Four times a day (QID) | ORAL | Status: DC | PRN

## 2012-03-15 MED ORDER — FERROUS SULFATE 325 (65 FE) MG PO TABS: 325.0000 mg | ORAL_TABLET | Freq: Every day | ORAL | Status: DC

## 2012-03-15 MED ORDER — IBUPROFEN 600 MG PO TABS: 600.0000 mg | ORAL_TABLET | Freq: Four times a day (QID) | ORAL | Status: DC

## 2012-03-15 NOTE — Progress Notes (Signed)
UR Chart review completed.  

## 2012-03-15 NOTE — Discharge Summary (Signed)
Physician Discharge Summary  Patient ID: Sarah Franklin MRN: [id number] DOB/AGE: [birth year] 34 y.o.  Admit date: 03/13/2012 Discharge date: 03/15/2012  Admission Diagnoses: Sarah Franklin was admitted 03/13/12 with History of SROM at 17 hrs. GBS neg and this patient desires no intervention labor process with intermittent monitoring.   Discharge Diagnoses:  Principal Problem:  *Vaginal delivery Active Problems:  SROM at 5p, 03/13/12  Perineal laceration, 1st degree   Discharged Condition: stable  Hospital Course: The patient was admitted with SROM, GBS neg. The patient had desired no intervention labor and intermittent monitoring. The patient progressed well with no intervention and no IV access.  NSVD viable female infant with no complications.  1st degree laceration with repair. Lactating mother. The patient desired early discharge. Both mother and baby discharged at 28 hrs post delivery. Both mother and baby were stable at time of discharge. Birth Control: undecided- would like a progesterone bases birth control at 6 weeks PP as she desires to breastfeed for along period.  Consults: None  Significant Diagnostic Studies: labs: Normal. Anatomy USS - normal  Treatments: IV hydration, Po percocet  Discharge Exam: Blood pressure 107/69, pulse 69, temperature 98.3 F (36.8 C), temperature source Oral, resp. rate 20, height 5\' 11"  (1.803 m), weight 261 lb (118.389 kg), last menstrual period 06/12/2011, unknown if currently breastfeeding.  Results for orders placed during the hospital encounter of 03/13/12 (from the past 48 hour(s))  CBC     Status: Abnormal   Collection Time   03/15/12  5:20 AM      Component Value Range Comment   WBC 12.5 (*) 4.0 - 10.5 K/uL    RBC 3.76 (*) 3.87 - 5.11 MIL/uL    Hemoglobin 9.3 (*) 12.0 - 15.0 g/dL    HCT 78.2 (*) 95.6 - 46.0 %    MCV 81.6  78.0 - 100.0 fL    MCH 24.7 (*) 26.0 - 34.0 pg    MCHC 30.3  30.0 - 36.0 g/dL    RDW 21.3  08.6 - 57.8 %     Platelets 250  150 - 400 K/uL      General appearance: alert, cooperative and no distress Affect: AAO x 3 Lungs: CTAB CV: RRR Abdomen: Soft, N/T, B/S x 4 Uterus -2/u Lochia: mod. Rubra GI: Normal GU: Normal Extremities: Normal  Postpartum anemia; advised po Iron Supplement  Disposition: 01-Home or Self Care     Medication List     As of 03/15/2012 11:33 AM    ASK your doctor about these medications         albuterol 108 (90 BASE) MCG/ACT inhaler   Commonly known as: PROVENTIL HFA;VENTOLIN HFA   Inhale 2 puffs into the lungs every 6 (six) hours as needed. Shortness of breath and wheezing      cyclobenzaprine 10 MG tablet   Commonly known as: FLEXERIL   Take 1 tablet (10 mg total) by mouth every 8 (eight) hours as needed for muscle spasms.      guaiFENesin 100 MG/5ML Soln   Commonly known as: ROBITUSSIN   Take 5 mLs by mouth every 4 (four) hours as needed. Cold symptoms      prenatal multivitamin Tabs   Take 1 tablet by mouth daily.      zolpidem 10 MG tablet   Commonly known as: AMBIEN   Take 1 tablet (10 mg total) by mouth at bedtime as needed.           Follow-up Information    Follow  up with [facility]. In 6 weeks. (As needed)    Contact information:   [address] [place] [address] [place] [address] [phone]       NSVD with no intervention, Viable female. Uncomplicated Course. Care as per [facility] handbook  Signed: Earl Gala, CNM. 03/15/2012, 11:33 AM

## 2012-03-15 NOTE — Progress Notes (Signed)
Patient was referred for history of depression/anxiety. * Referral screened out by Clinical Social Worker because none of the following criteria appear to apply: ~ History of anxiety/depression during this pregnancy, or of post-partum depression. ~ Diagnosis of anxiety and/or depression within last 3 years ~ History of depression due to pregnancy loss/loss of child OR * Patient's symptoms currently being treated with medication and/or therapy. Please contact the Clinical Social Worker if needs arise, or if patient requests.  SW spoke to bedside RN to see how MOB has been doing during her hospital stay.  She states she reviewed signs and symptoms of PPD with MOB and MOB states she is well aware.  She informed RN that she plans to start an antidepressant.

## 2012-03-16 ENCOUNTER — Ambulatory Visit (INDEPENDENT_AMBULATORY_CARE_PROVIDER_SITE_OTHER): Payer: Medicaid Other | Admitting: Obstetrics and Gynecology

## 2012-03-16 ENCOUNTER — Telehealth: Payer: Self-pay | Admitting: Obstetrics and Gynecology

## 2012-03-16 ENCOUNTER — Ambulatory Visit (INDEPENDENT_AMBULATORY_CARE_PROVIDER_SITE_OTHER): Payer: Medicaid Other | Admitting: Surgery

## 2012-03-16 ENCOUNTER — Encounter (INDEPENDENT_AMBULATORY_CARE_PROVIDER_SITE_OTHER): Payer: Self-pay | Admitting: Surgery

## 2012-03-16 ENCOUNTER — Encounter: Payer: Self-pay | Admitting: Obstetrics and Gynecology

## 2012-03-16 VITALS — BP 145/90 | HR 83 | Temp 98.6°F | Resp 18 | Ht 61.0 in | Wt 244.0 lb

## 2012-03-16 VITALS — BP 118/80 | Resp 16 | Wt 244.0 lb

## 2012-03-16 DIAGNOSIS — K648 Other hemorrhoids: Secondary | ICD-10-CM

## 2012-03-16 DIAGNOSIS — K645 Perianal venous thrombosis: Secondary | ICD-10-CM

## 2012-03-16 DIAGNOSIS — K649 Unspecified hemorrhoids: Secondary | ICD-10-CM

## 2012-03-16 NOTE — Telephone Encounter (Signed)
Per VL  Scheduled pt  appt at [facility] today at 3:15 for thrombosed hemorrhoid. Pt informed.

## 2012-03-16 NOTE — Telephone Encounter (Signed)
Pt called, is PP, states has a hemorrhoid that is the size of a half dollar, is hard and is painful, pt is very uncomfortable, pt says is unable to sit on her bottom, she has to lie down to breast feed.  Pt worked in today for appt for evaluation.

## 2012-03-16 NOTE — Progress Notes (Signed)
Subjective:     Patient ID: Sarah Franklin, female   DOB: 1975-08-19, 36 y.o.   MRN: [id number]  HPI  She is known to [place] status post incision and drainage of the thrombosed hemorrhoid on August 29. She is now 3 days postpartum and is again thrombosed hemorrhoid. Review of Systems     Objective:   Physical Exam On exam, she indeed has a very large thrombosed hemorrhoid. I prepped the area Betadine, anesthetized with lidocaine, an incision with a scalpel and drained several very large blood clots. I then packed the wound dry gauze    Assessment:     Thrombosed external hemorrhoid status post incision and drainage    Plan:     She will continued local wound care and sitz baths. She will come back and see [place] as needed

## 2012-03-16 NOTE — Progress Notes (Addendum)
DELIVERED 03/13/12 C/O PAINFUL HEMORRHOIDS    EPDS: 6

## 2012-03-17 ENCOUNTER — Telehealth: Payer: Self-pay | Admitting: Obstetrics and Gynecology

## 2012-03-17 NOTE — Telephone Encounter (Signed)
Pt called this am, states las pm passed a clot that was the size of a plum, but it did not happen again.  Pt just delivered.  Pt advised when sitting a while or lying down, a large clot can pass, pt denies having to change a soaked pad q hr.  Pt advised to continue monitoring.  If bldg worsens or other concerns pt to call office.

## 2012-03-21 ENCOUNTER — Ambulatory Visit (INDEPENDENT_AMBULATORY_CARE_PROVIDER_SITE_OTHER): Payer: Medicaid Other | Admitting: Surgery

## 2012-03-21 ENCOUNTER — Encounter (INDEPENDENT_AMBULATORY_CARE_PROVIDER_SITE_OTHER): Payer: Self-pay | Admitting: Surgery

## 2012-03-21 VITALS — BP 136/80 | HR 87 | Temp 97.7°F | Resp 18 | Ht 71.0 in | Wt 234.0 lb

## 2012-03-21 DIAGNOSIS — Z09 Encounter for follow-up examination after completed treatment for conditions other than malignant neoplasm: Secondary | ICD-10-CM

## 2012-03-21 NOTE — Progress Notes (Signed)
Subjective:     Patient ID: Sarah Franklin, female   DOB: 1976-05-09, 36 y.o.   MRN: [id number]  HPI She is status post incision and drainage of severe thrombosed external hemorrhoids last week. She is still having some persistent discomfort sitting in for reevaluation today.  Review of Systems     Objective:   Physical Exam On exam, there has been significant regression in the hemorrhoids although there are 2 areas of thrombosed again. I performed incision and drainage of these 2 areas with a scalpel after anesthetizing with lidocaine.    Assessment:     Thrombosed external hemorrhoids    Plan:     She will continue sitz bath, and stool softeners. I added lidocaine cream and steroid cream. She will come back if she does not improve.

## 2012-04-28 ENCOUNTER — Encounter: Payer: Self-pay | Admitting: Obstetrics and Gynecology

## 2012-04-28 ENCOUNTER — Ambulatory Visit (INDEPENDENT_AMBULATORY_CARE_PROVIDER_SITE_OTHER): Payer: Medicaid Other | Admitting: Obstetrics and Gynecology
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
## 2012-04-28 NOTE — Progress Notes (Signed)
Date of delivery: 03/14/2012 Female Name: Sarah Franklin Vaginal delivery:yes Cesarean section:no Tubal ligation:no GDM:no Breast Feeding:yes Bottle Feeding:no Post-Partum Blues:yes Abnormal pap:yes Normal GU function: yes Normal GI function:yes Returning to work:no EPDS: Score--13

## 2012-05-04 ENCOUNTER — Telehealth: Payer: Self-pay | Admitting: Obstetrics and Gynecology

## 2012-05-04 NOTE — Telephone Encounter (Signed)
Message copied by Mason Jim on Wed May 04, 2012 12:29 PM ------      Message from: Cornelius Moras      Created: Thu Apr 28, 2012  3:05 PM      Regarding: Referral to chiropracter       Please call [facility] and check on the process for referral of this pp patient with persistent right back and hip pain after pregnancy and delivery.  I wrote a letter that is in the chart re: this referral.            VL

## 2012-05-04 NOTE — Telephone Encounter (Signed)
Tc to pt.  Informed referral has been faxed to [facility] and their office will be calling to schedule appt.

## 2012-05-05 ENCOUNTER — Telehealth: Payer: Self-pay

## 2012-05-05 NOTE — Telephone Encounter (Signed)
Spoke to pt informing her Sarah [facility] called to inform us they don't accept Medicaid. Informed pt to check around for Chiropractors to find out which one does. Once she finds a [facility] that accepts Medicaid, give [place] a call so we can send them a referral. Pt agrees and comprehends.

## 2012-05-05 NOTE — Telephone Encounter (Signed)
Spoke with [facility] regarding referral. They don't accept Medicaid. Will inform VL.

## 2012-05-16 ENCOUNTER — Telehealth: Payer: Self-pay | Admitting: Obstetrics and Gynecology

## 2012-05-16 NOTE — Telephone Encounter (Signed)
Spoke with pt states daughter's pediatrician recommended pt to see gyn doctor due to possible mastitis. No available appts today. Checked with VL for Annice Pih to see if she can be worked in Advertising account executive. Appt possibly scheduled tomorrow at 3:30 pm. Awaiting approval from Angie to get Medicaid prior authorized. Pt aware and voices understanding.

## 2012-05-17 ENCOUNTER — Encounter: Payer: Self-pay | Admitting: Obstetrics and Gynecology

## 2012-05-17 ENCOUNTER — Ambulatory Visit (INDEPENDENT_AMBULATORY_CARE_PROVIDER_SITE_OTHER): Payer: Medicaid Other | Admitting: Obstetrics and Gynecology

## 2012-05-17 VITALS — BP 112/62 | Temp 98.9°F | Wt 240.0 lb

## 2012-05-17 DIAGNOSIS — O9122 Nonpurulent mastitis associated with the puerperium: Secondary | ICD-10-CM

## 2012-05-17 DIAGNOSIS — B3749 Other urogenital candidiasis: Secondary | ICD-10-CM

## 2012-05-17 DIAGNOSIS — B3741 Candidal cystitis and urethritis: Secondary | ICD-10-CM | POA: Insufficient documentation

## 2012-05-17 MED ORDER — FLUCONAZOLE 100 MG PO TABS: 100.0000 mg | ORAL_TABLET | Freq: Every day | ORAL | Status: AC

## 2012-05-17 MED ORDER — NYSTATIN 100000 UNIT/GM EX CREA: TOPICAL_CREAM | Freq: Two times a day (BID) | CUTANEOUS | Status: DC

## 2012-05-17 NOTE — Progress Notes (Signed)
Here for evaluation of pain in left breast x 1 week.   Denies fever, myalgia, or any erythema of the breast.  Pain is primarily on latch, with baby having more difficulty establishing adequate latch on left side.  Some nipple trauma per patient.  Has used gentian violet without benefit.  Good milk production.  Filed Vitals:   05/17/12 1543  Weight: 240 lb (108.863 kg)  Temp 98.9.  PE: Right breast WNL--lactating. Left breast--nipple red and cracked.  Aerola shiny and red.  No evidence mastitis in either breast.  Assessment: Probable breast yeast. y. Plan: Rx Nystatin cream and Diflucan 100 mg po--2 tablets on 1st day, then 1 tablet daily for a total of 10 days.  These sent via EPIC to patient's pharmacy of choice. Rx Dr. Allene Pyo Nipple Cream to [facility]. To f/u prn.  F/u with lactational consultants prn.

## 2012-05-17 NOTE — Progress Notes (Signed)
Pt c/o possible mastitis. Having tenderness in left breast x 1 week.

## 2012-06-20 ENCOUNTER — Encounter: Payer: Self-pay | Admitting: Obstetrics and Gynecology

## 2012-06-20 ENCOUNTER — Other Ambulatory Visit: Payer: Self-pay | Admitting: Obstetrics and Gynecology

## 2012-06-20 ENCOUNTER — Ambulatory Visit (INDEPENDENT_AMBULATORY_CARE_PROVIDER_SITE_OTHER): Payer: Medicaid Other | Admitting: Obstetrics and Gynecology

## 2012-06-20 VITALS — BP 110/72 | Resp 16 | Ht 71.0 in | Wt 240.0 lb

## 2012-06-20 DIAGNOSIS — Z124 Encounter for screening for malignant neoplasm of cervix: Secondary | ICD-10-CM

## 2012-06-20 DIAGNOSIS — Z0000 Encounter for general adult medical examination without abnormal findings: Secondary | ICD-10-CM

## 2012-06-20 MED ORDER — CEPHALEXIN 125 MG/5ML PO SUSR: 500.0000 mg | Freq: Two times a day (BID) | ORAL | Status: DC

## 2012-06-20 MED ORDER — SELECT-OB 29-1 MG PO CHEW: 1.0000 | CHEWABLE_TABLET | Freq: Every day | ORAL | Status: DC

## 2012-06-20 MED ORDER — ZOLPIDEM TARTRATE 10 MG PO TABS: 10.0000 mg | ORAL_TABLET | Freq: Every evening | ORAL | Status: AC | PRN

## 2012-06-20 NOTE — Progress Notes (Signed)
Last Pap: 03/2011 WNL: Yes Regular Periods:no Contraception: NONE   Monthly Breast exam:no Tetanus<26yrs:no Nl.Bladder Function:yes Daily BMs:yes Healthy Diet:yes Calcium:no Mammogram:no pt states she will need a mammogram after 35th b-day mother had Breast CA.  Date of Mammogram: N/A  Exercise:no Have often Exercise: N/A Seatbelt: yes Abuse at home: no Stressful work:no Sigmoid-colonoscopy: N/A Bone Density: No PCP: [place] [facility] Change in PMH: No Changes Change in FMH:No Changes

## 2012-06-20 NOTE — Progress Notes (Signed)
Subjective:    Sarah Franklin is a 36 y.o. female, [id number], who presents for an annual exam.   Patient reports:  Recurrence of hydradenitis on buttocks--requests antibiotic in liquid form. Also uses Ambien sporadically due to insomnia. Wants mammogram in 2014 due to age 54 on 06/22/12, and family hx of mother with early breast cancer. Currently still breastfeeding--recommended waiting till at least 6 months after completion of feeding. Not sexually active at present--"husband sleeping on couch" till cycles resume, so patient can follow NFP for contraception.     History   Social History  . Marital Status: Married    Spouse Name: Sarah Franklin    Number of Children: 2  . Years of Education: 16   Occupational History  . STUDENT    Social History Main Topics  . Smoking status: Former Smoker    Quit date: 01/21/2011  . Smokeless tobacco: Never Used  . Alcohol Use: No     Comment: rarely  . Drug Use: No  . Sexually Active: Yes -- Female partner(s)    Birth Control/ Protection: None   Other Topics Concern  . None   Social History Narrative  . None    Menstrual cycle:   LMP: Patient's last menstrual period was 06/21/2011.           Cycle: None  The following portions of the patient's history were reviewed and updated as appropriate: allergies, current medications, past family history, past medical history, past social history, past surgical history and problem list.  Review of Systems Pertinent items are noted in HPI. Breast:Negative for breast lump,nipple discharge or nipple retraction Gastrointestinal: Negative for abdominal pain, change in bowel habits or rectal bleeding Urinary:negative   Objective:    BP 110/72  Resp 16  Ht 5\' 11"  (1.803 m)  Wt 240 lb (108.863 kg)  BMI 33.47 kg/m2  LMP 06/21/2011  Breastfeeding? Yes    Weight:  Wt Readings from Last 1 Encounters:  06/20/12 240 lb (108.863 kg)          BMI: Body mass index is 33.47 kg/(m^2).  General  Appearance: Alert, appropriate appearance for age. No acute distress HEENT: Grossly normal Neck / Thyroid: Supple, no masses, nodes or enlargement Lungs: clear to auscultation bilaterally Back: No CVA tenderness Breast Exam: No masses or nodes.No dimpling, nipple retraction or discharge. Cardiovascular: Regular rate and rhythm. S1, S2, no murmur Gastrointestinal: Soft, non-tender, no masses or organomegaly Pelvic Exam: Vulva and vagina appear normal. Bimanual exam reveals normal uterus and adnexa. Rectovaginal: not indicated and normal rectal, no masses Lymphatic Exam: Non-palpable nodes in neck, clavicular, axillary, or inguinal regions  Skin: Several hydradenitis lesions noted on buttocks and inner thighs. Neurologic: Normal gait and speech, no tremor  Psychiatric: Alert and oriented, appropriate affect.   Wet Prep:not applicable Urinalysis:not applicable UPT: Not done   Assessment:    Normal gyn exam  Insomnia Hydradenitis   Plan:    Mammogram: Will plan in 2014 Pap:  Done today STD screening: declined Contraception:abstinence Other:  Rx Keflex 500 mg po BID x 7 days, no refills, liquid solution. Rx Ambien 10 mg po q hs prn, no refills. Rx Select OB PRV x 1 year      Sarah Franklin, [place], [place]

## 2012-06-23 LAB — PAP IG W/ RFLX HPV ASCU

## 2013-04-25 DIAGNOSIS — Z3202 Encounter for pregnancy test, result negative: Secondary | ICD-10-CM | POA: Insufficient documentation

## 2013-04-25 DIAGNOSIS — S339XXA Sprain of unspecified parts of lumbar spine and pelvis, initial encounter: Secondary | ICD-10-CM | POA: Insufficient documentation

## 2013-04-25 DIAGNOSIS — Z8619 Personal history of other infectious and parasitic diseases: Secondary | ICD-10-CM | POA: Insufficient documentation

## 2013-04-25 DIAGNOSIS — Z87891 Personal history of nicotine dependence: Secondary | ICD-10-CM | POA: Insufficient documentation

## 2013-04-25 DIAGNOSIS — Y939 Activity, unspecified: Secondary | ICD-10-CM | POA: Insufficient documentation

## 2013-04-25 DIAGNOSIS — M1710 Unilateral primary osteoarthritis, unspecified knee: Secondary | ICD-10-CM | POA: Insufficient documentation

## 2013-04-25 DIAGNOSIS — J45909 Unspecified asthma, uncomplicated: Secondary | ICD-10-CM | POA: Insufficient documentation

## 2013-04-25 DIAGNOSIS — Z792 Long term (current) use of antibiotics: Secondary | ICD-10-CM | POA: Insufficient documentation

## 2013-04-25 DIAGNOSIS — Z79899 Other long term (current) drug therapy: Secondary | ICD-10-CM | POA: Insufficient documentation

## 2013-04-25 DIAGNOSIS — Z8742 Personal history of other diseases of the female genital tract: Secondary | ICD-10-CM | POA: Insufficient documentation

## 2013-04-25 DIAGNOSIS — E669 Obesity, unspecified: Secondary | ICD-10-CM | POA: Insufficient documentation

## 2013-04-25 DIAGNOSIS — G8929 Other chronic pain: Secondary | ICD-10-CM | POA: Insufficient documentation

## 2013-04-25 DIAGNOSIS — Z8679 Personal history of other diseases of the circulatory system: Secondary | ICD-10-CM | POA: Insufficient documentation

## 2013-04-25 DIAGNOSIS — Y929 Unspecified place or not applicable: Secondary | ICD-10-CM | POA: Insufficient documentation

## 2013-04-25 DIAGNOSIS — D649 Anemia, unspecified: Secondary | ICD-10-CM | POA: Insufficient documentation

## 2013-04-25 DIAGNOSIS — X58XXXA Exposure to other specified factors, initial encounter: Secondary | ICD-10-CM | POA: Insufficient documentation

## 2013-04-26 ENCOUNTER — Encounter (HOSPITAL_BASED_OUTPATIENT_CLINIC_OR_DEPARTMENT_OTHER): Payer: Self-pay | Admitting: Emergency Medicine

## 2013-04-26 ENCOUNTER — Emergency Department (HOSPITAL_BASED_OUTPATIENT_CLINIC_OR_DEPARTMENT_OTHER)
Admission: EM | Admit: 2013-04-26 | Discharge: 2013-04-26 | Disposition: A | Discharge status: Home / Self Care | Payer: Medicaid Other | Attending: Emergency Medicine | Admitting: Emergency Medicine

## 2013-04-26 ENCOUNTER — Emergency Department (HOSPITAL_BASED_OUTPATIENT_CLINIC_OR_DEPARTMENT_OTHER): Payer: Medicaid Other

## 2013-04-26 MED ORDER — KETOROLAC TROMETHAMINE 30 MG/ML IJ SOLN
30.0000 mg | Freq: Once | INTRAMUSCULAR | Status: DC
  Filled 2013-04-26: qty 1

## 2013-04-26 MED ORDER — KETOROLAC TROMETHAMINE 30 MG/ML IJ SOLN
30.0000 mg | Freq: Once | INTRAMUSCULAR | Status: AC
Start: 2013-04-26 — End: 2013-04-26
  Administered 2013-04-26: 30 mg via INTRAMUSCULAR

## 2013-04-26 MED ORDER — IBUPROFEN 600 MG PO TABS: 600.0000 mg | ORAL_TABLET | Freq: Four times a day (QID) | ORAL | Status: DC | PRN

## 2013-04-26 MED ORDER — HYDROCODONE-ACETAMINOPHEN 5-325 MG PO TABS: 1.0000 | ORAL_TABLET | Freq: Four times a day (QID) | ORAL | Status: DC | PRN

## 2013-04-26 NOTE — ED Notes (Signed)
MD at bedside. 

## 2013-04-26 NOTE — ED Provider Notes (Signed)
CSN: [id number]     Arrival date & time 04/25/13  2347 History   First MD Initiated Contact with Patient 04/26/13 0002     Chief Complaint  Patient presents with  . Back Pain   (Consider location/radiation/quality/duration/timing/severity/associated sxs/prior Treatment) HPI  This is a 37 year old female who presents with back pain. Patient reports back pain since her pregnancy with her most recent child. The child is currently 84 months old. Patient reports worsening of back pain over the last 6 months. She is seeing her primary care physician and has been referred to a specialist but "I can't find anybody he's taking new patients."  Patient has also sought chiropractic care which has helped some. Patient reports worsening of pain over the last 3-4 days. She currently rates her pain at 6/10. She describes spasming of the left back that radiates into her left buttock and the back of her left leg. She takes Flexeril at home but states that this is not helping. She is not taking any NSAIDs.  She denies any urinary retention, bowel problems, weakness, numbness, or tingling, history of fever, IV drug use, or cancer.  Past Medical History  Diagnosis Date  . Placenta previa antepartum, second trimester 2006    20-34 WKS  . Abnormal Pap smear 2001    COLPO DONE;LAST PAP 2011 WAS NORMAL  . Ovarian cyst 14 YOA  . Complication of anesthesia     NEED PATCH BEHIND EAR TO KEEP FROM GETTING SICK  . Arthritis     HAS HAD 4 KNEE SURGERY  . Infection     YEAST INFECTION;NOT FREQUENT  . Anemia     WITH PREGNANCY;FeSO4 SUPP IN PAST  . Migraines 10 YOA    INDERAL, TOPAMAX, AMTRIPTYLINE IN PAST  . Asthma     ALBUTEROL AS NEEDED; AROUND FALL/WINTER MONTHS  . Pregnant   . h/o rapid labor 02/28/2012    Previous labors of 4hrs and 1hr   Past Surgical History  Procedure Laterality Date  . Cholecystectomy    . Appendectomy  1991  . Wisdom tooth extraction  1997    ALL 4 EXTRACTED  . Dilation and curettage  of uterus  2005  . Knee surgery  2001,2007,2009,2011  . Cholecystectomy  2005   Family History  Problem Relation Age of Onset  . Osteoporosis Mother   . Anemia Mother   . Depression Mother   . Osteoporosis Maternal Grandmother    History  Substance Use Topics  . Smoking status: Former Smoker    Quit date: 01/21/2011  . Smokeless tobacco: Never Used  . Alcohol Use: No   OB History   Grav Para Term Preterm Abortions TAB SAB Ect Mult Living   6 3 3  3  3   3      Review of Systems  Genitourinary:       Denies urinary retention  Musculoskeletal: Positive for back pain. Negative for gait problem.  Neurological: Negative for weakness and numbness.  All other systems reviewed and are negative.    Allergies  Mustard; Other; Topamax; Augmentin; and Eggs or egg-derived products  Home Medications   Current Outpatient Rx  Name  Route  Sig  Dispense  Refill  . albuterol (PROVENTIL HFA;VENTOLIN HFA) 108 (90 BASE) MCG/ACT inhaler   Inhalation   Inhale 2 puffs into the lungs every 6 (six) hours as needed. Shortness of breath and wheezing          . cephALEXin (KEFLEX) 125 MG/5ML suspension  Oral   Take 20 mLs (500 mg total) by mouth 2 (two) times daily.   200 mL   2   . ferrous sulfate 325 (65 FE) MG tablet   Oral   Take 1 tablet (325 mg total) by mouth daily with breakfast.   30 tablet   3   . HYDROcodone-acetaminophen (NORCO/VICODIN) 5-325 MG per tablet   Oral   Take 1 tablet by mouth every 6 (six) hours as needed.   10 tablet   0   . HYDROcodone-acetaminophen (VICODIN ES) 7.5-750 MG per tablet   Oral   Take 1 tablet by mouth every 6 (six) hours as needed for pain.   30 tablet   0   . ibuprofen (ADVIL,MOTRIN) 600 MG tablet   Oral   Take 1 tablet (600 mg total) by mouth every 6 (six) hours.   30 tablet   2   . ibuprofen (ADVIL,MOTRIN) 600 MG tablet   Oral   Take 1 tablet (600 mg total) by mouth every 6 (six) hours as needed.   30 tablet   0   .  nystatin cream (MYCOSTATIN)   Topical   Apply topically 2 (two) times daily. Apply, then reapply after each feeding.   30 g   2   . oxyCODONE-acetaminophen (PERCOCET/ROXICET) 5-325 MG per tablet   Oral   Take 2 tablets by mouth every 6 (six) hours as needed (moderate - severe pain).   14 tablet   0   . Prenatal Vit-Fe Fumarate-FA (PRENATAL MULTIVITAMIN) TABS   Oral   Take 1 tablet by mouth daily.         . Prenatal Vit-Fe Psac Cmplx-FA (SELECT-OB) 29-1 MG CHEW   Oral   Chew 1 tablet by mouth daily.   30 each   11    BP 152/78  Pulse 77  Temp(Src) 99 F (37.2 C) (Oral)  Resp 16  Ht 5' 10.5" (1.791 m)  Wt 250 lb (113.399 kg)  BMI 35.35 kg/m2  SpO2 100%  Breastfeeding? Yes Physical Exam  Nursing note and vitals reviewed. Constitutional: She is oriented to person, place, and time. She appears well-developed and well-nourished. No distress.  Obese   HENT:  Head: Normocephalic and atraumatic.  Cardiovascular: Normal rate, regular rhythm and normal heart sounds.   No murmur heard. Pulmonary/Chest: Effort normal. No respiratory distress.  Abdominal: Soft. Bowel sounds are normal. There is no tenderness.  Musculoskeletal:  No midline tenderness to palpation. Spasm noted over the left lower back  Neurological: She is alert and oriented to person, place, and time.  Normal reflexes, sensation intact to light touch, normal strength in bilateral lower extremities, normal gait  Skin: Skin is warm and dry.  Psychiatric: She has a normal mood and affect.    ED Course  Procedures (including critical care time) Labs Review Labs Reviewed  PREGNANCY, URINE   Imaging Review Dg Lumbar Spine Complete  04/26/2013   CLINICAL DATA:  Worsening low back pain.  EXAM: LUMBAR SPINE - COMPLETE 4+ VIEW  COMPARISON:  None available for comparison at time of study interpretation.  FINDINGS: Five non rib-bearing lumbar vertebra are intact and aligned with maintenance of the lumbar lordosis.  Intervertebral disc heights preserved. No pars interarticularis defects nor definite neural foraminal narrowing. Mild lower lumbar levoscoliosis, which may be in part positional. Sacroiliac joints are symmetric. 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 destructive bony lesions. Surgical clips in the right abdomen most consistent cholecystectomy.  IMPRESSION: No acute lumbar spine fracture deformity nor malalignment.   Electronically Signed   By: Awilda Metro   On: 04/26/2013 01:01    EKG Interpretation   None       MDM   1. Strain, lumbosacral, chronic or old    Patient presents with back pain. She denies acute injury and reports pain has just worsened over the last 2-3 days. She is nontoxic-appearing and comfortable. She is neurologically intact and without evidence of cauda equina symptoms. Plain films of lumbar spine are negative. Patient was given IM Toradol. She's not a candidate for Valium as she is lactating. Plain films are negative. Patient was encouraged to add NSAIDs to her pain management regimen at home. She will also be given a short course of Norco. She is to followup with primary care physician for referral to physical therapy.  After history, exam, and medical workup I feel the patient has been appropriately medically screened and is safe for discharge home. Pertinent diagnoses were discussed with the patient. Patient was given return precautions.     Shon Baton, MD 04/26/13 0111

## 2013-04-26 NOTE — ED Notes (Addendum)
Back pain since pregnancy - 13 months ago.  Worse x6 months.  Past couple days getting unbearable. Pt is breastfeeding.

## 2013-09-07 ENCOUNTER — Encounter (HOSPITAL_BASED_OUTPATIENT_CLINIC_OR_DEPARTMENT_OTHER): Payer: Self-pay | Admitting: Emergency Medicine

## 2013-09-07 ENCOUNTER — Emergency Department (HOSPITAL_BASED_OUTPATIENT_CLINIC_OR_DEPARTMENT_OTHER): Payer: Medicaid Other

## 2013-09-07 ENCOUNTER — Emergency Department (HOSPITAL_BASED_OUTPATIENT_CLINIC_OR_DEPARTMENT_OTHER)
Admission: EM | Admit: 2013-09-07 | Discharge: 2013-09-08 | Disposition: A | Discharge status: Home / Self Care | Payer: Medicaid Other | Attending: Emergency Medicine | Admitting: Emergency Medicine

## 2013-09-07 DIAGNOSIS — G43909 Migraine, unspecified, not intractable, without status migrainosus: Secondary | ICD-10-CM | POA: Insufficient documentation

## 2013-09-07 DIAGNOSIS — Z792 Long term (current) use of antibiotics: Secondary | ICD-10-CM | POA: Insufficient documentation

## 2013-09-07 DIAGNOSIS — S46919A Strain of unspecified muscle, fascia and tendon at shoulder and upper arm level, unspecified arm, initial encounter: Secondary | ICD-10-CM

## 2013-09-07 DIAGNOSIS — Y929 Unspecified place or not applicable: Secondary | ICD-10-CM | POA: Insufficient documentation

## 2013-09-07 DIAGNOSIS — Z87891 Personal history of nicotine dependence: Secondary | ICD-10-CM | POA: Insufficient documentation

## 2013-09-07 DIAGNOSIS — Z8619 Personal history of other infectious and parasitic diseases: Secondary | ICD-10-CM | POA: Insufficient documentation

## 2013-09-07 DIAGNOSIS — J45909 Unspecified asthma, uncomplicated: Secondary | ICD-10-CM | POA: Insufficient documentation

## 2013-09-07 DIAGNOSIS — X500XXA Overexertion from strenuous movement or load, initial encounter: Secondary | ICD-10-CM | POA: Insufficient documentation

## 2013-09-07 DIAGNOSIS — M129 Arthropathy, unspecified: Secondary | ICD-10-CM | POA: Insufficient documentation

## 2013-09-07 DIAGNOSIS — Z791 Long term (current) use of non-steroidal anti-inflammatories (NSAID): Secondary | ICD-10-CM | POA: Insufficient documentation

## 2013-09-07 DIAGNOSIS — D649 Anemia, unspecified: Secondary | ICD-10-CM | POA: Insufficient documentation

## 2013-09-07 DIAGNOSIS — Z8742 Personal history of other diseases of the female genital tract: Secondary | ICD-10-CM | POA: Insufficient documentation

## 2013-09-07 DIAGNOSIS — Y9389 Activity, other specified: Secondary | ICD-10-CM | POA: Insufficient documentation

## 2013-09-07 DIAGNOSIS — IMO0002 Reserved for concepts with insufficient information to code with codable children: Secondary | ICD-10-CM | POA: Insufficient documentation

## 2013-09-07 DIAGNOSIS — Z79899 Other long term (current) drug therapy: Secondary | ICD-10-CM | POA: Insufficient documentation

## 2013-09-07 MED ORDER — IBUPROFEN 800 MG PO TABS
800.0000 mg | ORAL_TABLET | Freq: Once | ORAL | Status: AC
  Administered 2013-09-08: 800 mg via ORAL
  Filled 2013-09-07: qty 1

## 2013-09-07 NOTE — ED Notes (Signed)
Pt c/o rt shoulder pain radiating down rt arm with numbness x2days, states a month ago she hurt same shoulder while lifting a box spring but no pain like this.

## 2013-09-07 NOTE — ED Provider Notes (Signed)
CSN: [id number]     Arrival date & time 09/07/13  2315 History  This chart was scribed for Jlee Harkless K Zana Biancardi-Rasch, MD by Elby Beck, ED Scribe. This patient was seen in room MH02/MH02 and the patient's care was started at 11:45 PM.   Chief Complaint  Patient presents with  . Shoulder Pain    Patient is a 38 y.o. female presenting with shoulder pain. The history is provided by the patient. No language interpreter was used.  Shoulder Pain This is a new problem. The current episode started more than 1 week ago. The problem occurs rarely. The problem has been gradually worsening. Pertinent negatives include no chest pain, no abdominal pain, no headaches and no shortness of breath. The symptoms are aggravated by bending. The symptoms are relieved by medications. Treatments tried: Flexeril. The treatment provided mild relief.    HPI Comments: Sarah Franklin is a 38 y.o. female who presents to the Emergency Department complaining of right shoulder pain that radiates down her right arm over the past month that worsened 2 days ago. She reports associated numbness in her right arm. She states that she had a mild injury to her right shoulder about 1 month ago. She states that she her pain is worsened with movement of her right arm. She reports that she has taken Flexeril with mild relief. She denies any other pain or symptoms.   Past Medical History  Diagnosis Date  . Placenta previa antepartum, second trimester 2006    20-34 WKS  . Abnormal Pap smear 2001    COLPO DONE;LAST PAP 2011 WAS NORMAL  . Ovarian cyst 12 YOA  . Complication of anesthesia     NEED PATCH BEHIND EAR TO KEEP FROM GETTING SICK  . Arthritis     HAS HAD 4 KNEE SURGERY  . Infection     YEAST INFECTION;NOT FREQUENT  . Anemia     WITH PREGNANCY;FeSO4 SUPP IN PAST  . Migraines 8 YOA    INDERAL, TOPAMAX, AMTRIPTYLINE IN PAST  . Asthma     ALBUTEROL AS NEEDED; AROUND FALL/WINTER MONTHS  . Pregnant   . h/o rapid labor  02/28/2012    Previous labors of 4hrs and 1hr   Past Surgical History  Procedure Laterality Date  . Cholecystectomy    . Appendectomy  1991  . Wisdom tooth extraction  1997    ALL 4 EXTRACTED  . Dilation and curettage of uterus  2005  . Knee surgery  2001,2007,2009,2011  . Cholecystectomy  2005   Family History  Problem Relation Age of Onset  . Osteoporosis Mother   . Anemia Mother   . Depression Mother   . Osteoporosis Maternal Grandmother    History  Substance Use Topics  . Smoking status: Former Smoker    Quit date: 01/21/2011  . Smokeless tobacco: Never Used  . Alcohol Use: No   OB History   Grav Para Term Preterm Abortions TAB SAB Ect Mult Living   6 3 3  3  3   3      Review of Systems  Respiratory: Negative for shortness of breath.   Cardiovascular: Negative for chest pain.  Gastrointestinal: Negative for abdominal pain.  Musculoskeletal: Positive for arthralgias (right shoulder).  Neurological: Negative for numbness (right arm) and headaches.  All other systems reviewed and are negative.   Allergies  Mustard; Other; Topamax; Augmentin; and Eggs or egg-derived products  Home Medications   Current Outpatient Rx  Name  Route  Sig  Dispense  Refill  . cyclobenzaprine (FLEXERIL) 10 MG tablet   Oral   Take 10 mg by mouth 3 (three) times daily as needed for muscle spasms (2 tabs at night only).         . Prenatal Vit-Fe Psac Cmplx-FA (SELECT-OB) 29-1 MG CHEW   Oral   Chew 1 tablet by mouth daily.   30 each   11   . albuterol (PROVENTIL HFA;VENTOLIN HFA) 108 (90 BASE) MCG/ACT inhaler   Inhalation   Inhale 2 puffs into the lungs every 6 (six) hours as needed. Shortness of breath and wheezing          . cephALEXin (KEFLEX) 125 MG/5ML suspension   Oral   Take 20 mLs (500 mg total) by mouth 2 (two) times daily.   200 mL   2   . ferrous sulfate 325 (65 FE) MG tablet   Oral   Take 1 tablet (325 mg total) by mouth daily with breakfast.   30 tablet    3   . HYDROcodone-acetaminophen (NORCO/VICODIN) 5-325 MG per tablet   Oral   Take 1 tablet by mouth every 6 (six) hours as needed.   10 tablet   0   . HYDROcodone-acetaminophen (VICODIN ES) 7.5-750 MG per tablet   Oral   Take 1 tablet by mouth every 6 (six) hours as needed for pain.   30 tablet   0   . ibuprofen (ADVIL,MOTRIN) 600 MG tablet   Oral   Take 1 tablet (600 mg total) by mouth every 6 (six) hours.   30 tablet   2   . ibuprofen (ADVIL,MOTRIN) 600 MG tablet   Oral   Take 1 tablet (600 mg total) by mouth every 6 (six) hours as needed.   30 tablet   0   . nystatin cream (MYCOSTATIN)   Topical   Apply topically 2 (two) times daily. Apply, then reapply after each feeding.   30 g   2   . oxyCODONE-acetaminophen (PERCOCET/ROXICET) 5-325 MG per tablet   Oral   Take 2 tablets by mouth every 6 (six) hours as needed (moderate - severe pain).   14 tablet   0   . Prenatal Vit-Fe Fumarate-FA (PRENATAL MULTIVITAMIN) TABS   Oral   Take 1 tablet by mouth daily.          Triage Vitals: BP 146/87  Pulse 87  Temp(Src) 98.6 F (37 C) (Oral)  Resp 18  Ht 5\' 11"  (1.803 m)  Wt 262 lb (118.842 kg)  BMI 36.56 kg/m2  SpO2 100%  LMP 08/26/2013  Physical Exam  Nursing note and vitals reviewed. Constitutional: She is oriented to person, place, and time. She appears well-developed and well-nourished. No distress.  HENT:  Head: Normocephalic and atraumatic.  Eyes: EOM are normal. Pupils are equal, round, and reactive to light.  Neck: Normal range of motion. Neck supple. No tracheal deviation present.  Cardiovascular: Normal rate, regular rhythm, normal heart sounds and intact distal pulses.   Pulmonary/Chest: Effort normal and breath sounds normal. No respiratory distress. She has no wheezes.  Abdominal: Soft. Bowel sounds are normal. There is no tenderness. There is no rebound.  Musculoskeletal: Normal range of motion. She exhibits no edema and no tenderness.  Radial  pulse is 3+. Right hand is NVI. Cap refill is less than 2 seconds. Coordination and supination intact. Biceps reflex intact. No winging of the scapula. NEER's test is negative.  Neurological: She is alert and oriented to person, place, and time.  She has normal reflexes.  Skin: Skin is warm and dry.  Psychiatric: She has a normal mood and affect. Her behavior is normal.    ED Course  Procedures (including critical care time)  DIAGNOSTIC STUDIES: Oxygen Saturation is 100% on RA, normal by my interpretation.    COORDINATION OF CARE: 11:50 PM- Discussed plan to obtain an X-ray of pt's right shoulder. WIll also order Ibuprofen. Pt advised of plan for treatment and pt agrees.  Medications  ibuprofen (ADVIL,MOTRIN) tablet 800 mg (not administered)   Labs Review Labs Reviewed - No data to display Imaging Review No results found.   EKG Interpretation None      MDM   Final diagnoses:  None   Ibuprofen 800 mg and follow up with sports medicine   I personally performed the services described in this documentation, which was scribed in my presence. The recorded information has been reviewed and is accurate.    Joshuajames Moehring Alfonso Patten, MD 09/08/13 [phone]

## 2013-09-07 NOTE — ED Notes (Signed)
Patient transported to X-ray 

## 2013-09-08 ENCOUNTER — Encounter (HOSPITAL_BASED_OUTPATIENT_CLINIC_OR_DEPARTMENT_OTHER): Payer: Self-pay | Admitting: Emergency Medicine

## 2013-09-08 MED ORDER — IBUPROFEN 800 MG PO TABS: 800.0000 mg | ORAL_TABLET | Freq: Three times a day (TID) | ORAL | Status: DC

## 2013-09-08 NOTE — Discharge Instructions (Signed)
Cryotherapy Cryotherapy means treatment with cold. Ice or gel packs can be used to reduce both pain and swelling. Ice is the most helpful within the first 24 to 48 hours after an injury or flareup from overusing a muscle or joint. Sprains, strains, spasms, burning pain, shooting pain, and aches can all be eased with ice. Ice can also be used when recovering from surgery. Ice is effective, has very few side effects, and is safe for most people to use. PRECAUTIONS  Ice is not a safe treatment option for people with:  Raynaud's phenomenon. This is a condition affecting small blood vessels in the extremities. Exposure to cold may cause your problems to return.  Cold hypersensitivity. There are many forms of cold hypersensitivity, including:  Cold urticaria. Red, itchy hives appear on the skin when the tissues begin to warm after being iced.  Cold erythema. This is a red, itchy rash caused by exposure to cold.  Cold hemoglobinuria. Red blood cells break down when the tissues begin to warm after being iced. The hemoglobin that carry oxygen are passed into the urine because they cannot combine with blood proteins fast enough.  Numbness or altered sensitivity in the area being iced. If you have any of the following conditions, do not use ice until you have discussed cryotherapy with your caregiver:  Heart conditions, such as arrhythmia, angina, or chronic heart disease.  High blood pressure.  Healing wounds or open skin in the area being iced.  Current infections.  Rheumatoid arthritis.  Poor circulation.  Diabetes. Ice slows the blood flow in the region it is applied. This is beneficial when trying to stop inflamed tissues from spreading irritating chemicals to surrounding tissues. However, if you expose your skin to cold temperatures for too long or without the proper protection, you can damage your skin or nerves. Watch for signs of skin damage due to cold. HOME CARE INSTRUCTIONS Follow  these tips to use ice and cold packs safely.  Place a dry or damp towel between the ice and skin. A damp towel will cool the skin more quickly, so you may need to shorten the time that the ice is used.  For a more rapid response, add gentle compression to the ice.  Ice for no more than 10 to 20 minutes at a time. The bonier the area you are icing, the less time it will take to get the benefits of ice.  Check your skin after 5 minutes to make sure there are no signs of a poor response to cold or skin damage.  Rest 20 minutes or more in between uses.  Once your skin is numb, you can end your treatment. You can test numbness by very lightly touching your skin. The touch should be so light that you do not see the skin dimple from the pressure of your fingertip. When using ice, most people will feel these normal sensations in this order: cold, burning, aching, and numbness.  Do not use ice on someone who cannot communicate their responses to pain, such as small children or people with dementia. HOW TO MAKE AN ICE PACK Ice packs are the most common way to use ice therapy. Other methods include ice massage, ice baths, and cryo-sprays. Muscle creams that cause a cold, tingly feeling do not offer the same benefits that ice offers and should not be used as a substitute unless recommended by your caregiver. To make an ice pack, do one of the following:  Place crushed ice or   a bag of frozen vegetables in a sealable plastic bag. Squeeze out the excess air. Place this bag inside another plastic bag. Slide the bag into a pillowcase or place a damp towel between your skin and the bag.  Mix 3 parts water with 1 part rubbing alcohol. Freeze the mixture in a sealable plastic bag. When you remove the mixture from the freezer, it will be slushy. Squeeze out the excess air. Place this bag inside another plastic bag. Slide the bag into a pillowcase or place a damp towel between your skin and the bag. SEEK MEDICAL  CARE IF:  You develop white spots on your skin. This may give the skin a blotchy (mottled) appearance.  Your skin turns blue or pale.  Your skin becomes waxy or hard.  Your swelling gets worse. MAKE SURE YOU:   Understand these instructions.  Will watch your condition.  Will get help right away if you are not doing well or get worse. Document Released: 02/02/2011 Document Revised: 08/31/2011 Document Reviewed: 02/02/2011 [employer] Patient Information 2014 [employer].  

## 2013-10-20 ENCOUNTER — Emergency Department (HOSPITAL_BASED_OUTPATIENT_CLINIC_OR_DEPARTMENT_OTHER): Payer: Medicaid Other

## 2013-10-20 ENCOUNTER — Emergency Department (HOSPITAL_BASED_OUTPATIENT_CLINIC_OR_DEPARTMENT_OTHER)
Admission: EM | Admit: 2013-10-20 | Discharge: 2013-10-20 | Disposition: A | Discharge status: Home / Self Care | Payer: Medicaid Other | Attending: Emergency Medicine | Admitting: Emergency Medicine

## 2013-10-20 ENCOUNTER — Encounter (HOSPITAL_BASED_OUTPATIENT_CLINIC_OR_DEPARTMENT_OTHER): Payer: Self-pay | Admitting: Emergency Medicine

## 2013-10-20 DIAGNOSIS — S99919A Unspecified injury of unspecified ankle, initial encounter: Principal | ICD-10-CM

## 2013-10-20 DIAGNOSIS — D649 Anemia, unspecified: Secondary | ICD-10-CM | POA: Insufficient documentation

## 2013-10-20 DIAGNOSIS — Z8742 Personal history of other diseases of the female genital tract: Secondary | ICD-10-CM | POA: Insufficient documentation

## 2013-10-20 DIAGNOSIS — Z79899 Other long term (current) drug therapy: Secondary | ICD-10-CM | POA: Insufficient documentation

## 2013-10-20 DIAGNOSIS — G43909 Migraine, unspecified, not intractable, without status migrainosus: Secondary | ICD-10-CM | POA: Insufficient documentation

## 2013-10-20 DIAGNOSIS — Y939 Activity, unspecified: Secondary | ICD-10-CM | POA: Insufficient documentation

## 2013-10-20 DIAGNOSIS — Z8619 Personal history of other infectious and parasitic diseases: Secondary | ICD-10-CM | POA: Insufficient documentation

## 2013-10-20 DIAGNOSIS — S99929A Unspecified injury of unspecified foot, initial encounter: Principal | ICD-10-CM

## 2013-10-20 DIAGNOSIS — Y929 Unspecified place or not applicable: Secondary | ICD-10-CM | POA: Insufficient documentation

## 2013-10-20 DIAGNOSIS — S8990XA Unspecified injury of unspecified lower leg, initial encounter: Secondary | ICD-10-CM | POA: Insufficient documentation

## 2013-10-20 DIAGNOSIS — Z792 Long term (current) use of antibiotics: Secondary | ICD-10-CM | POA: Insufficient documentation

## 2013-10-20 DIAGNOSIS — W010XXA Fall on same level from slipping, tripping and stumbling without subsequent striking against object, initial encounter: Secondary | ICD-10-CM | POA: Insufficient documentation

## 2013-10-20 DIAGNOSIS — M129 Arthropathy, unspecified: Secondary | ICD-10-CM | POA: Insufficient documentation

## 2013-10-20 DIAGNOSIS — J45909 Unspecified asthma, uncomplicated: Secondary | ICD-10-CM | POA: Insufficient documentation

## 2013-10-20 DIAGNOSIS — Z87891 Personal history of nicotine dependence: Secondary | ICD-10-CM | POA: Insufficient documentation

## 2013-10-20 DIAGNOSIS — S99921A Unspecified injury of right foot, initial encounter: Secondary | ICD-10-CM

## 2013-10-20 NOTE — ED Notes (Signed)
Patient transported to X-ray 

## 2013-10-20 NOTE — ED Provider Notes (Signed)
CSN: [id number]     Arrival date & time 10/20/13  1737 History   First MD Initiated Contact with Patient 10/20/13 1742     Chief Complaint  Patient presents with  . Toe Injury     (Consider location/radiation/quality/duration/timing/severity/associated sxs/prior Treatment) The history is provided by the patient.  ADANA MARIK is a 38 y.o. female here with R toe pain. She tripped over something and landed with her big toe under her. Denies head injury. Denies ankle pain. Denies other injuries.    Past Medical History  Diagnosis Date  . Placenta previa antepartum, second trimester 2006    20-34 WKS  . Abnormal Pap smear 2001    COLPO DONE;LAST PAP 2011 WAS NORMAL  . Ovarian cyst 12 YOA  . Complication of anesthesia     NEED PATCH BEHIND EAR TO KEEP FROM GETTING SICK  . Arthritis     HAS HAD 4 KNEE SURGERY  . Infection     YEAST INFECTION;NOT FREQUENT  . Anemia     WITH PREGNANCY;FeSO4 SUPP IN PAST  . Migraines 8 YOA    INDERAL, TOPAMAX, AMTRIPTYLINE IN PAST  . Asthma     ALBUTEROL AS NEEDED; AROUND FALL/WINTER MONTHS  . Pregnant   . h/o rapid labor 02/28/2012    Previous labors of 4hrs and 1hr   Past Surgical History  Procedure Laterality Date  . Cholecystectomy    . Appendectomy  1991  . Wisdom tooth extraction  1997    ALL 4 EXTRACTED  . Dilation and curettage of uterus  2005  . Knee surgery  2001,2007,2009,2011  . Cholecystectomy  2005   Family History  Problem Relation Age of Onset  . Osteoporosis Mother   . Anemia Mother   . Depression Mother   . Osteoporosis Maternal Grandmother    History  Substance Use Topics  . Smoking status: Former Smoker    Quit date: 01/21/2011  . Smokeless tobacco: Never Used  . Alcohol Use: No   OB History   Grav Para Term Preterm Abortions TAB SAB Ect Mult Living   6 3 3  3  3   3      Review of Systems  Musculoskeletal:       R toe pain   All other systems reviewed and are negative.     Allergies  Mustard;  Other; Topamax; Augmentin; and Eggs or egg-derived products  Home Medications   Prior to Admission medications   Medication Sig Start Date End Date Taking? Authorizing Provider  albuterol (PROVENTIL HFA;VENTOLIN HFA) 108 (90 BASE) MCG/ACT inhaler Inhale 2 puffs into the lungs every 6 (six) hours as needed. Shortness of breath and wheezing     Historical Provider, MD  cephALEXin (KEFLEX) 125 MG/5ML suspension Take 20 mLs (500 mg total) by mouth 2 (two) times daily. 06/20/12   Donnel Saxon, CNM  cyclobenzaprine (FLEXERIL) 10 MG tablet Take 10 mg by mouth 3 (three) times daily as needed for muscle spasms (2 tabs at night only).    Historical Provider, MD  ferrous sulfate 325 (65 FE) MG tablet Take 1 tablet (325 mg total) by mouth daily with breakfast. 03/15/12   Wyatt Mage, CNM  HYDROcodone-acetaminophen (NORCO/VICODIN) 5-325 MG per tablet Take 1 tablet by mouth every 6 (six) hours as needed. 04/26/13   Merryl Hacker, MD  HYDROcodone-acetaminophen (VICODIN ES) 7.5-750 MG per tablet Take 1 tablet by mouth every 6 (six) hours as needed for pain. 03/15/12   Wyatt Mage, CNM  ibuprofen (  ADVIL,MOTRIN) 600 MG tablet Take 1 tablet (600 mg total) by mouth every 6 (six) hours. 03/15/12   Wyatt Mage, CNM  ibuprofen (ADVIL,MOTRIN) 600 MG tablet Take 1 tablet (600 mg total) by mouth every 6 (six) hours as needed. 04/26/13   Merryl Hacker, MD  nystatin cream (MYCOSTATIN) Apply topically 2 (two) times daily. Apply, then reapply after each feeding. 05/17/12   Donnel Saxon, CNM  oxyCODONE-acetaminophen (PERCOCET/ROXICET) 5-325 MG per tablet Take 2 tablets by mouth every 6 (six) hours as needed (moderate - severe pain). 03/15/12   Wyatt Mage, CNM  Prenatal Vit-Fe Fumarate-FA (PRENATAL MULTIVITAMIN) TABS Take 1 tablet by mouth daily.    Historical Provider, MD  Prenatal Vit-Fe Psac Cmplx-FA (SELECT-OB) 29-1 MG CHEW Chew 1 tablet by mouth daily. 06/20/12   Donnel Saxon, CNM   BP 173/103  Pulse 78   Temp(Src) 98.5 F (36.9 C) (Oral)  Resp 16  Ht 6' (1.829 m)  Wt 270 lb (122.471 kg)  BMI 36.61 kg/m2  SpO2 99%  LMP 10/17/2013 Physical Exam  Nursing note and vitals reviewed. Constitutional: She is oriented to person, place, and time. She appears well-developed and well-nourished.  HENT:  Head: Normocephalic.  Eyes: Conjunctivae and EOM are normal. Pupils are equal, round, and reactive to light.  Neck: Normal range of motion. Neck supple.  Cardiovascular: Normal rate.   Pulmonary/Chest: Effort normal.  Abdominal: Soft.  Musculoskeletal:  Tender 1st MTP joint. Mild tenderness R big toe. 2+ pulses. No base of 5th tenderness. No obvious deformity.   Neurological: She is alert and oriented to person, place, and time.  Skin: Skin is warm and dry.  Psychiatric: She has a normal mood and affect. Her behavior is normal. Judgment and thought content normal.    ED Course  Procedures (including critical care time) Labs Review Labs Reviewed - No data to display  Imaging Review Dg Foot Complete Right  10/20/2013   CLINICAL DATA:  Right great toe injury  EXAM: RIGHT FOOT COMPLETE - 3+ VIEW  COMPARISON:  None.  FINDINGS: No fracture or dislocation is seen.  The joint spaces are preserved.  The visualized soft tissues are unremarkable.  IMPRESSION: No fracture or dislocation is seen.   Electronically Signed   By: Julian Hy M.D.   On: 10/20/2013 18:29     EKG Interpretation None      MDM   Final diagnoses:  None    JOVITA PERSING is a 38 y.o. female here with R big toe pain s/p fall. Will get xrays.   6:43 PM Xray no obvious fracture. Will d/c home. Has crutches at home. Recommend leg elevation, tylenol, motrin, crutches prn, ice.    Wandra Arthurs, MD 10/20/13 [phone]

## 2013-10-20 NOTE — ED Notes (Addendum)
Right great toe injury today-tripped on shoe

## 2013-10-20 NOTE — Discharge Instructions (Signed)
Take tylenol, motrin for pain.   Apply ice, elevate leg.   Use your own crutches as needed.   Follow up with your doctor.   Return to ER if you have severe pain, unable to walk.

## 2013-11-14 ENCOUNTER — Encounter (INDEPENDENT_AMBULATORY_CARE_PROVIDER_SITE_OTHER): Payer: Self-pay

## 2013-11-14 ENCOUNTER — Ambulatory Visit (INDEPENDENT_AMBULATORY_CARE_PROVIDER_SITE_OTHER): Payer: Medicaid Other | Admitting: Family Medicine

## 2013-11-14 ENCOUNTER — Encounter: Payer: Self-pay | Admitting: Family Medicine

## 2013-11-14 VITALS — BP 129/83 | HR 79 | Temp 98.0°F | Ht 70.0 in | Wt 273.0 lb

## 2013-11-14 DIAGNOSIS — S46909A Unspecified injury of unspecified muscle, fascia and tendon at shoulder and upper arm level, unspecified arm, initial encounter: Secondary | ICD-10-CM

## 2013-11-14 DIAGNOSIS — M25511 Pain in right shoulder: Secondary | ICD-10-CM

## 2013-11-14 DIAGNOSIS — S4980XA Other specified injuries of shoulder and upper arm, unspecified arm, initial encounter: Secondary | ICD-10-CM

## 2013-11-14 DIAGNOSIS — M25519 Pain in unspecified shoulder: Secondary | ICD-10-CM

## 2013-11-14 DIAGNOSIS — S4991XA Unspecified injury of right shoulder and upper arm, initial encounter: Secondary | ICD-10-CM

## 2013-11-14 NOTE — Patient Instructions (Signed)
We will go ahead with an MRI to assess for a rotator cuff tear - we typically call you the afternoon following the MRI to go over results and next steps.

## 2013-11-14 NOTE — Assessment & Plan Note (Signed)
Very concerning given mechanism, limited ROM, no improvement over > 6 weeks with home exercise program for a rotator cuff tear - supraspinatus and infraspinatus.  Recommend we move forward with MRI to assess.  Will contact her with results and how to proceed.

## 2013-11-14 NOTE — Progress Notes (Addendum)
Patient ID: BOOTS MCGLOWN, female   DOB: 23-Oct-1975, 38 y.o.   MRN: [id number]  PCP: Ted Mcalpine  Subjective:   HPI: Patient is a 38 y.o. female here for right shoulder pain.  Patient reports about 4 months ago she had right hand up on a wall, bent down to pick something up and felt a sharp pull in lateral right shoulder. Difficulty doing overhead motions following this. Did identical thing later that day with much more severe pain and limitation of motion. Has been taking flexeril as needed. Radiographs in ED negative. Cannot lie on right side. No radiation past elbow. Has been doing a home exercise program given by ED since 3/19 without results.  Past Medical History  Diagnosis Date  . Placenta previa antepartum, second trimester 2006    20-34 WKS  . Abnormal Pap smear 2001    COLPO DONE;LAST PAP 2011 WAS NORMAL  . Ovarian cyst 12 YOA  . Complication of anesthesia     NEED PATCH BEHIND EAR TO KEEP FROM GETTING SICK  . Arthritis     HAS HAD 4 KNEE SURGERY  . Infection     YEAST INFECTION;NOT FREQUENT  . Anemia     WITH PREGNANCY;FeSO4 SUPP IN PAST  . Migraines 8 YOA    INDERAL, TOPAMAX, AMTRIPTYLINE IN PAST  . Asthma     ALBUTEROL AS NEEDED; AROUND FALL/WINTER MONTHS  . Pregnant   . h/o rapid labor 02/28/2012    Previous labors of 4hrs and 1hr    Current Outpatient Prescriptions on File Prior to Visit  Medication Sig Dispense Refill  . cyclobenzaprine (FLEXERIL) 10 MG tablet Take 10 mg by mouth 3 (three) times daily as needed for muscle spasms (2 tabs at night only).      . Prenatal Vit-Fe Fumarate-FA (PRENATAL MULTIVITAMIN) TABS Take 1 tablet by mouth daily.       No current facility-administered medications on file prior to visit.    Past Surgical History  Procedure Laterality Date  . Cholecystectomy    . Appendectomy  1991  . Wisdom tooth extraction  1997    ALL 4 EXTRACTED  . Dilation and curettage of uterus  2005  . Knee surgery   2001,2007,2009,2011  . Cholecystectomy  2005    Allergies  Allergen Reactions  . Madelaine Bhat Isothiocyanate] Anaphylaxis  . Other Anaphylaxis    Oranges Reaction:   . Topamax Hives and Shortness Of Breath  . Augmentin [Amoxicillin-Pot Clavulanate] Nausea And Vomiting  . Eggs Or Egg-Derived Products Nausea And Vomiting    GI Upset    History   Social History  . Marital Status: Married    Spouse Name: DAN Burby    Number of Children: 2  . Years of Education: 16   Occupational History  . STUDENT    Social History Main Topics  . Smoking status: Former Smoker    Quit date: 01/21/2011  . Smokeless tobacco: Never Used  . Alcohol Use: No  . Drug Use: No  . Sexual Activity: Not on file   Other Topics Concern  . Not on file   Social History Narrative  . No narrative on file    Family History  Problem Relation Age of Onset  . Osteoporosis Mother   . Anemia Mother   . Depression Mother   . Osteoporosis Maternal Grandmother     BP 129/83  Pulse 79  Temp(Src) 98 F (36.7 C) (Oral)  Ht 5\' 10"  (1.778 m)  Wt 273 lb (  123.832 kg)  BMI 39.17 kg/m2  LMP 10/26/2013  Review of Systems: See HPI above.    Objective:  Physical Exam:  Gen: NAD  Right shoulder: No swelling, ecchymoses.  No gross deformity. Mild AC, anterior shoulder tenderness, Full ER and IR.  Abduction to 80 degrees, flexion to 90 degrees. Positive Hawkins, Neers. Negative Yergasons. Strength 3/5 with empty can, 4/5 with ER, 5/5 resisted IR Negative apprehension. NV intact distally.    Assessment & Plan:  1. Right shoulder injury - Very concerning given mechanism, limited ROM, no improvement over > 6 weeks with home exercise program for a rotator cuff tear - supraspinatus and infraspinatus.  Recommend we move forward with MRI to assess.  Will contact her with results and how to proceed.  Addendum:  Patient's MRI reviewed and discussed with her.  No evidence of rotator cuff, labral tear.   She does have bursitis, tendinitis.  Reviewed options: PT (only one visit allowed though by medicaid), nitro patches (has h/o migraines), injection - she will think about these and let us know how she would like to proceed.

## 2013-12-02 ENCOUNTER — Ambulatory Visit (HOSPITAL_BASED_OUTPATIENT_CLINIC_OR_DEPARTMENT_OTHER)
Admission: RE | Admit: 2013-12-02 | Discharge: 2013-12-02 | Disposition: A | Discharge status: Home / Self Care | Payer: Medicaid Other | Source: Ambulatory Visit | Attending: Family Medicine | Admitting: Family Medicine

## 2013-12-02 DIAGNOSIS — M719 Bursopathy, unspecified: Secondary | ICD-10-CM | POA: Insufficient documentation

## 2013-12-02 DIAGNOSIS — M25519 Pain in unspecified shoulder: Secondary | ICD-10-CM | POA: Diagnosis present

## 2013-12-02 DIAGNOSIS — M67919 Unspecified disorder of synovium and tendon, unspecified shoulder: Secondary | ICD-10-CM | POA: Insufficient documentation

## 2013-12-02 DIAGNOSIS — M25511 Pain in right shoulder: Secondary | ICD-10-CM

## 2013-12-13 ENCOUNTER — Encounter (HOSPITAL_BASED_OUTPATIENT_CLINIC_OR_DEPARTMENT_OTHER): Payer: Self-pay | Admitting: Emergency Medicine

## 2013-12-13 ENCOUNTER — Emergency Department (HOSPITAL_BASED_OUTPATIENT_CLINIC_OR_DEPARTMENT_OTHER)
Admission: EM | Admit: 2013-12-13 | Discharge: 2013-12-13 | Disposition: A | Discharge status: Home / Self Care | Payer: Medicaid Other | Attending: Emergency Medicine | Admitting: Emergency Medicine

## 2013-12-13 DIAGNOSIS — J320 Chronic maxillary sinusitis: Secondary | ICD-10-CM | POA: Insufficient documentation

## 2013-12-13 DIAGNOSIS — Z87891 Personal history of nicotine dependence: Secondary | ICD-10-CM | POA: Insufficient documentation

## 2013-12-13 DIAGNOSIS — J45909 Unspecified asthma, uncomplicated: Secondary | ICD-10-CM | POA: Insufficient documentation

## 2013-12-13 DIAGNOSIS — Z8739 Personal history of other diseases of the musculoskeletal system and connective tissue: Secondary | ICD-10-CM | POA: Insufficient documentation

## 2013-12-13 DIAGNOSIS — Z8619 Personal history of other infectious and parasitic diseases: Secondary | ICD-10-CM | POA: Insufficient documentation

## 2013-12-13 DIAGNOSIS — J029 Acute pharyngitis, unspecified: Secondary | ICD-10-CM | POA: Insufficient documentation

## 2013-12-13 DIAGNOSIS — Z79899 Other long term (current) drug therapy: Secondary | ICD-10-CM | POA: Insufficient documentation

## 2013-12-13 DIAGNOSIS — J0100 Acute maxillary sinusitis, unspecified: Secondary | ICD-10-CM

## 2013-12-13 DIAGNOSIS — Z8742 Personal history of other diseases of the female genital tract: Secondary | ICD-10-CM | POA: Insufficient documentation

## 2013-12-13 DIAGNOSIS — G43909 Migraine, unspecified, not intractable, without status migrainosus: Secondary | ICD-10-CM | POA: Insufficient documentation

## 2013-12-13 DIAGNOSIS — Z862 Personal history of diseases of the blood and blood-forming organs and certain disorders involving the immune mechanism: Secondary | ICD-10-CM | POA: Insufficient documentation

## 2013-12-13 DIAGNOSIS — J069 Acute upper respiratory infection, unspecified: Secondary | ICD-10-CM | POA: Insufficient documentation

## 2013-12-13 MED ORDER — AMOXICILLIN 250 MG/5ML PO SUSR: 500.0000 mg | Freq: Two times a day (BID) | ORAL | Status: DC

## 2013-12-13 MED ORDER — HYDROCODONE-HOMATROPINE 5-1.5 MG/5ML PO SYRP: 5.0000 mL | ORAL_SOLUTION | Freq: Four times a day (QID) | ORAL | Status: DC | PRN

## 2013-12-13 NOTE — ED Provider Notes (Signed)
CSN: [id number]     Arrival date & time 12/13/13  2050 History   First MD Initiated Contact with Patient 12/13/13 2100     Chief Complaint  Patient presents with  . URI     (Consider location/radiation/quality/duration/timing/severity/associated sxs/prior Treatment) HPI Comments: Patient is a 38 year old female who presents with a 1 week history of multiple symptoms including productive cough with green sputum, sinus pressure, sore throat, sinus congestion, and subjective fever. Patient reports her daughter has been sick with a sinus infection and she thinks she may have the same. She tried OTC medication with mild relief. No other associated symptoms. No aggravating/alleviating factors.    Past Medical History  Diagnosis Date  . Placenta previa antepartum, second trimester 2006    20-34 WKS  . Abnormal Pap smear 2001    COLPO DONE;LAST PAP 2011 WAS NORMAL  . Ovarian cyst 12 YOA  . Complication of anesthesia     NEED PATCH BEHIND EAR TO KEEP FROM GETTING SICK  . Arthritis     HAS HAD 4 KNEE SURGERY  . Infection     YEAST INFECTION;NOT FREQUENT  . Anemia     WITH PREGNANCY;FeSO4 SUPP IN PAST  . Migraines 8 YOA    INDERAL, TOPAMAX, AMTRIPTYLINE IN PAST  . Asthma     ALBUTEROL AS NEEDED; AROUND FALL/WINTER MONTHS  . Pregnant   . h/o rapid labor 02/28/2012    Previous labors of 4hrs and 1hr   Past Surgical History  Procedure Laterality Date  . Cholecystectomy    . Appendectomy  1991  . Wisdom tooth extraction  1997    ALL 4 EXTRACTED  . Dilation and curettage of uterus  2005  . Knee surgery  2001,2007,2009,2011  . Cholecystectomy  2005   Family History  Problem Relation Age of Onset  . Osteoporosis Mother   . Anemia Mother   . Depression Mother   . Osteoporosis Maternal Grandmother    History  Substance Use Topics  . Smoking status: Former Smoker    Quit date: 01/21/2011  . Smokeless tobacco: Never Used  . Alcohol Use: No   OB History   Grav Para Term Preterm  Abortions TAB SAB Ect Mult Living   6 3 3  3  3   3      Review of Systems  Constitutional: Negative for fever, chills and fatigue.  HENT: Positive for congestion, sinus pressure and sore throat. Negative for trouble swallowing.   Eyes: Negative for visual disturbance.  Respiratory: Positive for cough. Negative for shortness of breath.   Cardiovascular: Negative for chest pain and palpitations.  Gastrointestinal: Negative for nausea, vomiting, abdominal pain and diarrhea.  Genitourinary: Negative for dysuria and difficulty urinating.  Musculoskeletal: Negative for arthralgias and neck pain.  Skin: Negative for color change.  Neurological: Negative for dizziness and weakness.  Psychiatric/Behavioral: Negative for dysphoric mood.      Allergies  Mustard; Other; Topamax; Augmentin; and Eggs or egg-derived products  Home Medications   Prior to Admission medications   Medication Sig Start Date End Date Taking? Authorizing Sion Thane  cyclobenzaprine (FLEXERIL) 10 MG tablet Take 10 mg by mouth 3 (three) times daily as needed for muscle spasms (2 tabs at night only).    Historical Zahriyah Joo, MD  norethindrone (ORTHO MICRONOR) 0.35 MG tablet Take 1 tablet by mouth daily.    Historical Adarius Tigges, MD  Prenatal Vit-Fe Fumarate-FA (PRENATAL MULTIVITAMIN) TABS Take 1 tablet by mouth daily.    Historical Kimarion Chery, MD  BP 147/90  Pulse 72  Temp(Src) 99.6 F (37.6 C) (Oral)  Resp 16  Ht 6' (1.829 m)  Wt 272 lb (123.378 kg)  BMI 36.88 kg/m2  SpO2 100%  LMP 11/20/2013 Physical Exam  Nursing note and vitals reviewed. Constitutional: She appears well-developed and well-nourished. No distress.  HENT:  Head: Normocephalic and atraumatic.  Mouth/Throat: Oropharynx is clear and moist. No oropharyngeal exudate.  Maxillary sinus tenderness to palpation.   Eyes: Conjunctivae and EOM are normal.  Neck: Normal range of motion.  Cardiovascular: Normal rate and regular rhythm.  Exam reveals no gallop  and no friction rub.   No murmur heard. Pulmonary/Chest: Effort normal and breath sounds normal. She has no wheezes. She has no rales. She exhibits no tenderness.  Abdominal: There is no tenderness.  Musculoskeletal: Normal range of motion.  Lymphadenopathy:    She has cervical adenopathy.  Neurological: She is alert.  Speech is goal-oriented. Moves limbs without ataxia.   Skin: Skin is warm and dry.  Psychiatric: She has a normal mood and affect. Her behavior is normal.    ED Course  Procedures (including critical care time) Labs Review Labs Reviewed - No data to display  Imaging Review No results found.   EKG Interpretation None      MDM   Final diagnoses:  Subacute maxillary sinusitis    9:26 PM Patient will be treated for sinusitis with amoxicillin. Patient will have hycodan for cough. Vitals stable and patient afebrile. Patient instructed to return with worsening or concerning symptoms.     Alvina Chou, PA-C 12/13/13 2211

## 2013-12-13 NOTE — ED Notes (Signed)
EDPA at [facility]. Pt alert, NAD, calm, interactive, skin W&D, resps e/u, speaking in clear complete sentences, reading book sitting upright in bed. Daughter has a sinus infection. C/o sx onset 9d ago. C/o cough, congestion, cold sx, dizziness, chills and sore throat with cough. Productive white phlegm.

## 2013-12-13 NOTE — ED Notes (Signed)
Denies questions, needs sx or concerns unmet.

## 2013-12-13 NOTE — Discharge Instructions (Signed)
Take amoxicillin as directed until gone. Take hycodan as needed for cough. Refer to attached documents for more information.  °

## 2013-12-13 NOTE — ED Notes (Signed)
Pt c/o URI symptoms x 1 week 

## 2013-12-16 NOTE — ED Provider Notes (Signed)
Medical screening examination/treatment/procedure(s) were performed by non-physician practitioner and as supervising physician I was immediately available for consultation/collaboration.    Dorie Rank, MD 12/16/13 [phone]

## 2013-12-28 ENCOUNTER — Emergency Department (HOSPITAL_BASED_OUTPATIENT_CLINIC_OR_DEPARTMENT_OTHER)
Admission: EM | Admit: 2013-12-28 | Discharge: 2013-12-29 | Disposition: A | Discharge status: Home / Self Care | Payer: Medicaid Other | Attending: Emergency Medicine | Admitting: Emergency Medicine

## 2013-12-28 ENCOUNTER — Encounter (HOSPITAL_BASED_OUTPATIENT_CLINIC_OR_DEPARTMENT_OTHER): Payer: Self-pay | Admitting: Emergency Medicine

## 2013-12-28 ENCOUNTER — Emergency Department (HOSPITAL_BASED_OUTPATIENT_CLINIC_OR_DEPARTMENT_OTHER): Payer: Medicaid Other

## 2013-12-28 DIAGNOSIS — S90129A Contusion of unspecified lesser toe(s) without damage to nail, initial encounter: Secondary | ICD-10-CM | POA: Diagnosis not present

## 2013-12-28 DIAGNOSIS — Z87891 Personal history of nicotine dependence: Secondary | ICD-10-CM | POA: Insufficient documentation

## 2013-12-28 DIAGNOSIS — Z792 Long term (current) use of antibiotics: Secondary | ICD-10-CM | POA: Insufficient documentation

## 2013-12-28 DIAGNOSIS — Z8739 Personal history of other diseases of the musculoskeletal system and connective tissue: Secondary | ICD-10-CM | POA: Diagnosis not present

## 2013-12-28 DIAGNOSIS — Z79899 Other long term (current) drug therapy: Secondary | ICD-10-CM | POA: Diagnosis not present

## 2013-12-28 DIAGNOSIS — Y9289 Other specified places as the place of occurrence of the external cause: Secondary | ICD-10-CM | POA: Diagnosis not present

## 2013-12-28 DIAGNOSIS — Z862 Personal history of diseases of the blood and blood-forming organs and certain disorders involving the immune mechanism: Secondary | ICD-10-CM | POA: Diagnosis not present

## 2013-12-28 DIAGNOSIS — J45909 Unspecified asthma, uncomplicated: Secondary | ICD-10-CM | POA: Insufficient documentation

## 2013-12-28 DIAGNOSIS — IMO0002 Reserved for concepts with insufficient information to code with codable children: Secondary | ICD-10-CM | POA: Insufficient documentation

## 2013-12-28 DIAGNOSIS — Z8679 Personal history of other diseases of the circulatory system: Secondary | ICD-10-CM | POA: Diagnosis not present

## 2013-12-28 DIAGNOSIS — Z8619 Personal history of other infectious and parasitic diseases: Secondary | ICD-10-CM | POA: Insufficient documentation

## 2013-12-28 DIAGNOSIS — Y9389 Activity, other specified: Secondary | ICD-10-CM | POA: Insufficient documentation

## 2013-12-28 DIAGNOSIS — S8990XA Unspecified injury of unspecified lower leg, initial encounter: Secondary | ICD-10-CM | POA: Diagnosis present

## 2013-12-28 DIAGNOSIS — S90122A Contusion of left lesser toe(s) without damage to nail, initial encounter: Secondary | ICD-10-CM

## 2013-12-28 DIAGNOSIS — Z8742 Personal history of other diseases of the female genital tract: Secondary | ICD-10-CM | POA: Insufficient documentation

## 2013-12-28 NOTE — ED Notes (Signed)
Pt stubbed her left little toe this morning and tonight it is bruised and the pain is not resolving.

## 2013-12-29 NOTE — ED Provider Notes (Signed)
CSN: [id number]     Arrival date & time 12/28/13  2300 History   First MD Initiated Contact with Patient 12/29/13 0117     Chief Complaint  Patient presents with  . Toe Injury     (Consider location/radiation/quality/duration/timing/severity/associated sxs/prior Treatment) HPI 38 year old female accidentally stubbed her left small toe within the last 24 hours and has bruising to that toe with no weakness no numbness no brisk skin no pain to the rest of the foot no other injury no pain to her ankle or the rest of her leg. She is no neck pain, back pain, injury to her arms or her right leg at all. There is no treatment prior to arrival. She does not want pain medicines in the emergency room. Her pain is mild to moderate result patient movement without radiation or associated symptoms. Past Medical History  Diagnosis Date  . Placenta previa antepartum, second trimester 2006    20-34 WKS  . Abnormal Pap smear 2001    COLPO DONE;LAST PAP 2011 WAS NORMAL  . Ovarian cyst 12 YOA  . Complication of anesthesia     NEED PATCH BEHIND EAR TO KEEP FROM GETTING SICK  . Arthritis     HAS HAD 4 KNEE SURGERY  . Infection     YEAST INFECTION;NOT FREQUENT  . Anemia     WITH PREGNANCY;FeSO4 SUPP IN PAST  . Migraines 8 YOA    INDERAL, TOPAMAX, AMTRIPTYLINE IN PAST  . Asthma     ALBUTEROL AS NEEDED; AROUND FALL/WINTER MONTHS  . Pregnant   . h/o rapid labor 02/28/2012    Previous labors of 4hrs and 1hr   Past Surgical History  Procedure Laterality Date  . Cholecystectomy    . Appendectomy  1991  . Wisdom tooth extraction  1997    ALL 4 EXTRACTED  . Dilation and curettage of uterus  2005  . Knee surgery  2001,2007,2009,2011  . Cholecystectomy  2005   Family History  Problem Relation Age of Onset  . Osteoporosis Mother   . Anemia Mother   . Depression Mother   . Osteoporosis Maternal Grandmother    History  Substance Use Topics  . Smoking status: Former Smoker    Quit date: 01/21/2011  .  Smokeless tobacco: Never Used  . Alcohol Use: No   OB History   Grav Para Term Preterm Abortions TAB SAB Ect Mult Living   6 3 3  3  3   3      Review of Systems See HPI.   Allergies  Mustard; Other; Topamax; Augmentin; and Eggs or egg-derived products  Home Medications   Prior to Admission medications   Medication Sig Start Date End Date Taking? Authorizing Provider  amoxicillin (AMOXIL) 250 MG/5ML suspension Take 10 mLs (500 mg total) by mouth 2 (two) times daily. 12/13/13   Kaitlyn Szekalski, PA-C  cyclobenzaprine (FLEXERIL) 10 MG tablet Take 10 mg by mouth 3 (three) times daily as needed for muscle spasms (2 tabs at night only).    Historical Provider, MD  HYDROcodone-homatropine (HYCODAN) 5-1.5 MG/5ML syrup Take 5 mLs by mouth every 6 (six) hours as needed. 12/13/13   Kaitlyn Szekalski, PA-C  norethindrone (ORTHO MICRONOR) 0.35 MG tablet Take 1 tablet by mouth daily.    Historical Provider, MD  Prenatal Vit-Fe Fumarate-FA (PRENATAL MULTIVITAMIN) TABS Take 1 tablet by mouth daily.    Historical Provider, MD   BP 164/90  Pulse 67  Temp(Src) 98.7 F (37.1 C) (Oral)  Resp 16  Ht  6' (1.829 m)  Wt 272 lb (123.378 kg)  BMI 36.88 kg/m2  SpO2 100%  LMP 11/20/2013 Physical Exam  Nursing note and vitals reviewed. Constitutional:  Awake, alert, nontoxic appearance.  HENT:  Head: Atraumatic.  Eyes: Right eye exhibits no discharge. Left eye exhibits no discharge.  Neck: Neck supple.  Pulmonary/Chest: Effort normal. She exhibits no tenderness.  Abdominal: Soft. There is no tenderness. There is no rebound.  Musculoskeletal: She exhibits tenderness.  Baseline ROM, no obvious new focal weakness. No tenderness to cervical spine back arms or right leg. Left leg is nontender at the hip thigh knee proximal fibula calf Achilles tendon and ankle and mid foot. Left foot dorsalis pedis pulse intact normal light touch capillary refill less than 2 seconds good movement with ecchymosis tenderness  localized to the left fifth toe only the rest of the foot nontender.  Neurological: She is alert.  Mental status and motor strength appears baseline for patient and situation.  Skin: No rash noted.  Psychiatric: She has a normal mood and affect.    ED Course  Procedures (including critical care time) Patient informed of clinical course, understand medical decision-making process, and agree with plan. Labs Review Labs Reviewed - No data to display  Imaging Review Dg Toe 5th Left  12/28/2013   CLINICAL DATA:  Bruising of the left fifth toe after injury.  EXAM: DG TOE 5TH LEFT  COMPARISON:  None.  FINDINGS: There is no evidence of fracture or dislocation. There is no evidence of arthropathy or other focal bone abnormality. Soft tissues are unremarkable.  IMPRESSION: Negative.   Electronically Signed   By: Lucienne Capers M.D.   On: 12/28/2013 23:54     EKG Interpretation None      MDM   Final diagnoses:  Contusion of fifth toe of left foot, initial encounter    I doubt any other EMC precluding discharge at this time including, but not necessarily limited to the following:midfoot Fx.    Babette Relic, MD 12/29/13 [phone]

## 2013-12-29 NOTE — Discharge Instructions (Signed)
Buddy Taping of Toes We have taped your toes together to keep them from moving. This is called "buddy taping" since we used a part of your own body to keep the injured part still. We placed soft padding between your toes to keep them from rubbing against each other. Buddy taping will help with healing and to reduce pain. Keep your toes buddy taped together for as long as directed by your caregiver. HOME CARE INSTRUCTIONS   Raise your injured area above the level of your heart while sitting or lying down. Prop it up with pillows.  An ice pack used every twenty minutes, while awake, for the first one to two days may be helpful. Put ice in a plastic bag and put a towel between the bag and your skin.  Watch for signs that the taping is too tight. These signs may be:  Numbness of your taped toes.  Coolness of your taped toes.  Color change in the area beyond the tape.  Increased pain.  If you have any of these signs, loosen or rewrap the tape. If you need to loosen or rewrap the buddy tape, make sure you use the padding again. SEEK IMMEDIATE MEDICAL CARE IF:   You have worse pain, swelling, inflammation (soreness), drainage or bleeding after you rewrap the tape.  Any new problems occur. MAKE SURE YOU:   Understand these instructions.  Will watch your condition.  Will get help right away if you are not doing well or get worse. Document Released: 03/12/2004 Document Revised: 08/31/2011 Document Reviewed: 06/05/2008 [facility] Patient Information 2015 [place], [place]. This information is not intended to replace advice given to you by your health care provider. Make sure you discuss any questions you have with your health care provider.  Return sooner to your doctor or the ED if you develop weakness or numbness to her foot, redness, fever, pus drainage, or other concerns. Use Buddy tape for comfort as needed.

## 2013-12-29 NOTE — ED Notes (Signed)
Left little to inj this am  Slight swelling

## 2014-04-23 ENCOUNTER — Encounter (HOSPITAL_BASED_OUTPATIENT_CLINIC_OR_DEPARTMENT_OTHER): Payer: Self-pay | Admitting: Emergency Medicine

## 2014-06-19 ENCOUNTER — Emergency Department (HOSPITAL_BASED_OUTPATIENT_CLINIC_OR_DEPARTMENT_OTHER)
Admission: EM | Admit: 2014-06-19 | Discharge: 2014-06-19 | Disposition: A | Discharge status: Home / Self Care | Payer: Medicaid Other | Attending: Emergency Medicine | Admitting: Emergency Medicine

## 2014-06-19 ENCOUNTER — Encounter (HOSPITAL_BASED_OUTPATIENT_CLINIC_OR_DEPARTMENT_OTHER): Payer: Self-pay | Admitting: *Deleted

## 2014-06-19 DIAGNOSIS — J45909 Unspecified asthma, uncomplicated: Secondary | ICD-10-CM | POA: Insufficient documentation

## 2014-06-19 DIAGNOSIS — H1032 Unspecified acute conjunctivitis, left eye: Secondary | ICD-10-CM | POA: Diagnosis not present

## 2014-06-19 DIAGNOSIS — D649 Anemia, unspecified: Secondary | ICD-10-CM | POA: Insufficient documentation

## 2014-06-19 DIAGNOSIS — H5712 Ocular pain, left eye: Secondary | ICD-10-CM | POA: Diagnosis not present

## 2014-06-19 DIAGNOSIS — Z8739 Personal history of other diseases of the musculoskeletal system and connective tissue: Secondary | ICD-10-CM | POA: Insufficient documentation

## 2014-06-19 DIAGNOSIS — H109 Unspecified conjunctivitis: Secondary | ICD-10-CM

## 2014-06-19 DIAGNOSIS — Z8742 Personal history of other diseases of the female genital tract: Secondary | ICD-10-CM | POA: Diagnosis not present

## 2014-06-19 DIAGNOSIS — Z79899 Other long term (current) drug therapy: Secondary | ICD-10-CM | POA: Diagnosis not present

## 2014-06-19 DIAGNOSIS — Z87891 Personal history of nicotine dependence: Secondary | ICD-10-CM | POA: Insufficient documentation

## 2014-06-19 DIAGNOSIS — Z8619 Personal history of other infectious and parasitic diseases: Secondary | ICD-10-CM | POA: Diagnosis not present

## 2014-06-19 MED ORDER — HYDROCODONE-ACETAMINOPHEN 5-325 MG PO TABS: 1.0000 | ORAL_TABLET | ORAL | Status: DC | PRN

## 2014-06-19 MED ORDER — FLUORESCEIN SODIUM 1 MG OP STRP
ORAL_STRIP | OPHTHALMIC | Status: AC
  Filled 2014-06-19: qty 1

## 2014-06-19 MED ORDER — CIPROFLOXACIN HCL 0.3 % OP SOLN: 2.0000 [drp] | OPHTHALMIC | Status: AC

## 2014-06-19 MED ORDER — TETRACAINE HCL 0.5 % OP SOLN
OPHTHALMIC | Status: AC
  Filled 2014-06-19: qty 2

## 2014-06-19 NOTE — ED Provider Notes (Signed)
CSN: [id number]     Arrival date & time 06/19/14  1745 History  This chart was scribed for Mariea Clonts, MD by Peyton Bottoms, ED Scribe. This patient was seen in room MH01/MH01 and the patient's care was started at 6:18 PM.   Chief Complaint  Patient presents with  . Eye Problem   Patient is a 38 y.o. female presenting with eye problem. The history is provided by the patient. No language interpreter was used.  Eye Problem Location:  L eye Quality:  Burning and stinging Severity:  Moderate Onset quality:  Gradual Duration:  2 days Timing:  Constant Progression:  Unchanged Chronicity:  New Context: contact lenses and scratch   Relieved by:  Nothing Worsened by:  Bright light Associated symptoms: photophobia     HPI Comments: Sarah Franklin is a 38 y.o. female who presents to the Emergency Department complaining of scratch to left eye that occurred 3 days ago. She reports associated itchiness, burning, and photophobia to left eye. 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 She denies any other associated pain. Patient states that she is currently breastfeeding.  Past Medical History  Diagnosis Date  . Placenta previa antepartum, second trimester 2006    20-34 WKS  . Abnormal Pap smear 2001    COLPO Franklin;LAST PAP 2011 WAS NORMAL  . Ovarian cyst 12 YOA  . Complication of anesthesia     NEED PATCH BEHIND EAR TO KEEP FROM GETTING SICK  . Arthritis     HAS HAD 4 KNEE SURGERY  . Infection     YEAST INFECTION;NOT FREQUENT  . Anemia     WITH PREGNANCY;FeSO4 SUPP IN PAST  . Migraines 8 YOA    INDERAL, TOPAMAX, AMTRIPTYLINE IN PAST  . Asthma     ALBUTEROL AS NEEDED; AROUND FALL/WINTER MONTHS  . Pregnant   . h/o rapid labor 02/28/2012    Previous labors of 4hrs and 1hr   Past Surgical History  Procedure Laterality Date  . Cholecystectomy    . Appendectomy  1991  . Wisdom tooth extraction  1997    ALL 4 EXTRACTED  . Dilation and  curettage of uterus  2005  . Knee surgery  2001,2007,2009,2011  . Cholecystectomy  2005   Family History  Problem Relation Age of Onset  . Osteoporosis Mother   . Anemia Mother   . Depression Mother   . Osteoporosis Maternal Grandmother    History  Substance Use Topics  . Smoking status: Former Smoker    Quit date: 01/21/2011  . Smokeless tobacco: Never Used  . Alcohol Use: No   OB History    Gravida Para Term Preterm AB TAB SAB Ectopic Multiple Living   6 3 3  3  3   3      Review of Systems  Eyes: Positive for photophobia.  All other systems reviewed and are negative.  Allergies  Mustard; Other; Topamax; Augmentin; and Eggs or egg-derived products  Home Medications   Prior to Admission medications   Medication Sig Start Date End Date Taking? Authorizing Provider  ciprofloxacin (CILOXAN) 0.3 % ophthalmic solution Place 2 drops into the left eye every 4 (four) hours while awake. Administer 1 drop, every 2 hours, while awake, for 2 days. Then 1 drop, every 4 hours, while awake, for the next 5 days. 06/19/14 06/23/14  Mariea Clonts, MD  cyclobenzaprine (FLEXERIL) 10 MG tablet Take 10 mg by mouth 3 (three) times daily  as needed for muscle spasms (2 tabs at night only).    Historical Provider, MD  HYDROcodone-acetaminophen (NORCO) 5-325 MG per tablet Take 1-2 tablets by mouth every 4 (four) hours as needed. 06/19/14   Mariea Clonts, MD  norethindrone (ORTHO MICRONOR) 0.35 MG tablet Take 1 tablet by mouth daily.    Historical Provider, MD  Prenatal Vit-Fe Fumarate-FA (PRENATAL MULTIVITAMIN) TABS Take 1 tablet by mouth daily.    Historical Provider, MD   Triage Vitals: BP 142/86 mmHg  Pulse 72  Temp(Src) 98.7 F (37.1 C)  Resp 16  Ht 6' (1.829 m)  Wt 260 lb (117.935 kg)  BMI 35.25 kg/m2  SpO2 100%  LMP 06/18/2014  Physical Exam  Constitutional: She is oriented to person, place, and time. She appears well-developed and well-nourished. No distress.  HENT:  Head:  Normocephalic and atraumatic.  Eyes: Pupils are equal, round, and reactive to light. Left conjunctiva is injected.  Mild conjunctival injection on the left. Mild photophobia on the left. No focal increased uptake.  Neck: Neck supple. No tracheal deviation present.  Cardiovascular: Normal rate.   Pulmonary/Chest: Effort normal. No respiratory distress.  Musculoskeletal: Normal range of motion.  Neurological: She is alert and oriented to person, place, and time. No cranial nerve deficit. She exhibits normal muscle tone. Coordination normal.  Skin: Skin is warm and dry. No rash noted.  Psychiatric: She has a normal mood and affect. Her behavior is normal.  Nursing note and vitals reviewed.  ED Course  Procedures (including critical care time) Intraocular pressure checked 16 left eye with Tono-Pen DIAGNOSTIC STUDIES: Oxygen Saturation is 100% on RA, normal by my interpretation.    COORDINATION OF CARE: 6:21 PM- Used fluorescein ophthalmic strip and tetracaine ophthalmic solution to evaluate patient's left eye. Will give patient Cipro eye drops and Norco for pain management. Pt advised of plan for treatment and pt agrees.  Labs Review Labs Reviewed - No data to display  Imaging Review No results found.   EKG Interpretation None      MDM   Final diagnoses:  Conjunctivitis, left eye  Left eye pain    I personally performed the services described in this documentation, which was scribed in my presence. The recorded information has been reviewed and is accurate.  Discussed topical antibiotics and close follow-up with ophthalmology. No focused increased uptake or obvious ulcer visualized  Results and differential diagnosis were discussed with the patient/parent/guardian. Close follow up outpatient was discussed, comfortable with the plan.   Medications - No data to display  Filed Vitals:   06/19/14 1750  BP: 142/86  Pulse: 72  Temp: 98.7 F (37.1 C)  Resp: 16  Height: 6'  (1.829 m)  Weight: 260 lb (117.935 kg)  SpO2: 100%    Final diagnoses:  Conjunctivitis, left eye  Left eye pain      Mariea Clonts, MD 06/20/14 [phone]

## 2014-06-19 NOTE — Discharge Instructions (Signed)
Or the doctor listed on your discharge instructions for close follow-up especially since she wear contact lenses. Please do not wear contact lenses until you see the eye doctor and are cleared. If you were given medicines take as directed.  If you are on coumadin or contraceptives realize their levels and effectiveness is altered by many different medicines.  If you have any reaction (rash, tongues swelling, other) to the medicines stop taking and see a physician.   For severe pain take norco or vicodin however realize they have the potential for addiction and it can make you sleepy and has tylenol in it.  No operating machinery while taking.  Please follow up as directed and return to the ER or see a physician for new or worsening symptoms.  Thank you. Filed Vitals:   06/19/14 1750  BP: 142/86  Pulse: 72  Temp: 98.7 F (37.1 C)  Resp: 16  Height: 6' (1.829 m)  Weight: 260 lb (117.935 kg)  SpO2: 100%

## 2014-06-19 NOTE — ED Notes (Signed)
MD at bedside. 

## 2014-06-19 NOTE — ED Notes (Signed)
Pt c/o left eye redness and pain x 4 days

## 2014-08-11 ENCOUNTER — Emergency Department (HOSPITAL_BASED_OUTPATIENT_CLINIC_OR_DEPARTMENT_OTHER)
Admission: EM | Admit: 2014-08-11 | Discharge: 2014-08-11 | Disposition: A | Discharge status: Home / Self Care | Payer: Medicaid Other | Attending: Emergency Medicine | Admitting: Emergency Medicine

## 2014-08-11 ENCOUNTER — Emergency Department (HOSPITAL_BASED_OUTPATIENT_CLINIC_OR_DEPARTMENT_OTHER): Payer: Medicaid Other

## 2014-08-11 ENCOUNTER — Encounter (HOSPITAL_BASED_OUTPATIENT_CLINIC_OR_DEPARTMENT_OTHER): Payer: Self-pay | Admitting: *Deleted

## 2014-08-11 DIAGNOSIS — W231XXA Caught, crushed, jammed, or pinched between stationary objects, initial encounter: Secondary | ICD-10-CM | POA: Insufficient documentation

## 2014-08-11 DIAGNOSIS — J45909 Unspecified asthma, uncomplicated: Secondary | ICD-10-CM | POA: Insufficient documentation

## 2014-08-11 DIAGNOSIS — G43909 Migraine, unspecified, not intractable, without status migrainosus: Secondary | ICD-10-CM | POA: Insufficient documentation

## 2014-08-11 DIAGNOSIS — Y998 Other external cause status: Secondary | ICD-10-CM | POA: Diagnosis not present

## 2014-08-11 DIAGNOSIS — S9031XA Contusion of right foot, initial encounter: Secondary | ICD-10-CM | POA: Insufficient documentation

## 2014-08-11 DIAGNOSIS — Z8619 Personal history of other infectious and parasitic diseases: Secondary | ICD-10-CM | POA: Insufficient documentation

## 2014-08-11 DIAGNOSIS — Z79899 Other long term (current) drug therapy: Secondary | ICD-10-CM | POA: Diagnosis not present

## 2014-08-11 DIAGNOSIS — S99921A Unspecified injury of right foot, initial encounter: Secondary | ICD-10-CM | POA: Diagnosis present

## 2014-08-11 DIAGNOSIS — D649 Anemia, unspecified: Secondary | ICD-10-CM | POA: Insufficient documentation

## 2014-08-11 DIAGNOSIS — M1990 Unspecified osteoarthritis, unspecified site: Secondary | ICD-10-CM | POA: Diagnosis not present

## 2014-08-11 DIAGNOSIS — Z87891 Personal history of nicotine dependence: Secondary | ICD-10-CM | POA: Insufficient documentation

## 2014-08-11 DIAGNOSIS — Z8742 Personal history of other diseases of the female genital tract: Secondary | ICD-10-CM | POA: Insufficient documentation

## 2014-08-11 DIAGNOSIS — Y9289 Other specified places as the place of occurrence of the external cause: Secondary | ICD-10-CM | POA: Diagnosis not present

## 2014-08-11 DIAGNOSIS — Y9389 Activity, other specified: Secondary | ICD-10-CM | POA: Diagnosis not present

## 2014-08-11 MED ORDER — NAPROXEN 500 MG PO TABS: 500.0000 mg | ORAL_TABLET | Freq: Two times a day (BID) | ORAL | Status: DC | PRN

## 2014-08-11 MED ORDER — HYDROCODONE-ACETAMINOPHEN 5-325 MG PO TABS: 1.0000 | ORAL_TABLET | Freq: Four times a day (QID) | ORAL | Status: DC | PRN

## 2014-08-11 NOTE — ED Provider Notes (Signed)
CSN: [id number]     Arrival date & time 08/11/14  1526 History   First MD Initiated Contact with Patient 08/11/14 1712     Chief Complaint  Patient presents with  . Foot Pain     (Consider location/radiation/quality/duration/timing/severity/associated sxs/prior Treatment) HPI Comments: Sarah Franklin is a 39 y.o. female with a PMHx of knee arthritis, anemia, migraines, asthma, and ovarian cysts, who presents to the ED with complaints of right foot pain 2 weeks after she dropped a child's play chair on her foot. She reports 7/10 sharp and aching pain to the dorsum of the right foot along the third to the fifth tarsals, nonradiating, constant, worse with walking, improved with heat, with no other medications tried prior to arrival. Denies any numbness, tingling, or weakness. Initially states that it did bruise slightly, which is now resolved almost entirely. Denies any fevers, erythema, or warmth. Denies any history of gout. Able to ambulate with some pain.  Patient is a 39 y.o. female presenting with lower extremity pain. The history is provided by the patient. No language interpreter was used.  Foot Pain This is a new problem. The current episode started 1 to 4 weeks ago. The problem occurs constantly. The problem has been unchanged. Associated symptoms include arthralgias (R foot). Pertinent negatives include no chills, fever, joint swelling, myalgias, numbness or weakness. The symptoms are aggravated by walking. She has tried heat for the symptoms. The treatment provided mild relief.    Past Medical History  Diagnosis Date  . Placenta previa antepartum, second trimester 2006    20-34 WKS  . Abnormal Pap smear 2001    COLPO DONE;LAST PAP 2011 WAS NORMAL  . Ovarian cyst 14 YOA  . Complication of anesthesia     NEED PATCH BEHIND EAR TO KEEP FROM GETTING SICK  . Arthritis     HAS HAD 4 KNEE SURGERY  . Infection     YEAST INFECTION;NOT FREQUENT  . Anemia     WITH PREGNANCY;FeSO4  SUPP IN PAST  . Migraines 10 YOA    INDERAL, TOPAMAX, AMTRIPTYLINE IN PAST  . Asthma     ALBUTEROL AS NEEDED; AROUND FALL/WINTER MONTHS  . Pregnant   . h/o rapid labor 02/28/2012    Previous labors of 4hrs and 1hr   Past Surgical History  Procedure Laterality Date  . Cholecystectomy    . Appendectomy  1991  . Wisdom tooth extraction  1997    ALL 4 EXTRACTED  . Dilation and curettage of uterus  2005  . Knee surgery  2001,2007,2009,2011  . Cholecystectomy  2005   Family History  Problem Relation Age of Onset  . Osteoporosis Mother   . Anemia Mother   . Depression Mother   . Osteoporosis Maternal Grandmother    History  Substance Use Topics  . Smoking status: Former Smoker    Quit date: 01/21/2011  . Smokeless tobacco: Never Used  . Alcohol Use: No   OB History    Gravida Para Term Preterm AB TAB SAB Ectopic Multiple Living   6 3 3  3  3   3      Review of Systems  Constitutional: Negative for fever and chills.  Musculoskeletal: Positive for arthralgias (R foot). Negative for myalgias and joint swelling.  Skin: Positive for color change (slight bruise to R foot). Negative for pallor and wound.  Neurological: Negative for weakness and numbness.   10 Systems reviewed and are negative for acute change except as noted in the HPI.  Allergies  Mustard; Other; Topamax; Augmentin; and Eggs or egg-derived products  Home Medications   Prior to Admission medications   Medication Sig Start Date End Date Taking? Authorizing Provider  cyclobenzaprine (FLEXERIL) 10 MG tablet Take 10 mg by mouth 3 (three) times daily as needed for muscle spasms (2 tabs at night only).    Historical Provider, MD  HYDROcodone-acetaminophen (NORCO) 5-325 MG per tablet Take 1-2 tablets by mouth every 4 (four) hours as needed. 06/19/14   Mariea Clonts, MD  norethindrone (ORTHO MICRONOR) 0.35 MG tablet Take 1 tablet by mouth daily.    Historical Provider, MD  Prenatal Vit-Fe Fumarate-FA (PRENATAL  MULTIVITAMIN) TABS Take 1 tablet by mouth daily.    Historical Provider, MD   BP 144/81 mmHg  Pulse 68  Temp(Src) 98.4 F (36.9 C) (Oral)  Resp 19  Ht 6' (1.829 m)  Wt 260 lb (117.935 kg)  BMI 35.25 kg/m2  SpO2 99%  LMP 08/11/2014 Physical Exam  Constitutional: She is oriented to person, place, and time. Vital signs are normal. She appears well-developed and well-nourished.  Non-toxic appearance. No distress.  Afebrile nontoxic NAD  HENT:  Head: Normocephalic and atraumatic.  Mouth/Throat: Mucous membranes are normal.  Eyes: Conjunctivae and EOM are normal. Right eye exhibits no discharge. Left eye exhibits no discharge.  Neck: Normal range of motion. Neck supple.  Cardiovascular: Normal rate and intact distal pulses.   Pulmonary/Chest: Effort normal. No respiratory distress.  Abdominal: Normal appearance. She exhibits no distension.  Musculoskeletal: Normal range of motion.       Right foot: There is tenderness. There is normal range of motion, no bony tenderness, no swelling, normal capillary refill, no crepitus and no deformity.       Feet:  R foot with TTP over 3rd-5th metatarsals with trace bruising noted, no swelling or deformity, no crepitus, cap refill brisk and present, distal pulses intact, sensation grossly intact, wiggles all digits with strength 5/5 in all extremities. R ankle with FROM intact and no TTP.  Neurological: She is alert and oriented to person, place, and time. She has normal strength. No sensory deficit.  Skin: Skin is warm, dry and intact. No rash noted.  Psychiatric: She has a normal mood and affect. Her behavior is normal.  Nursing note and vitals reviewed.   ED Course  Procedures (including critical care time) Labs Review Labs Reviewed - No data to display  Imaging Review Dg Foot Complete Right  08/11/2014   CLINICAL DATA:  Dorsal right foot pain in the region of the fourth and fifth toes after dropping a chair on top of her foot 2 weeks ago.   EXAM: RIGHT FOOT COMPLETE - 3+ VIEW  COMPARISON:  10/20/2013.  FINDINGS: Minimal posterior calcaneal spur formation. No fracture or dislocation.  IMPRESSION: No fracture.   Electronically Signed   By: Claudie Revering M.D.   On: 08/11/2014 17:23     EKG Interpretation None      MDM   Final diagnoses:  Foot contusion, right, initial encounter    39 y.o. female with contusion to R foot 2wks ago, having ongoing pain. Neurovascularly intact with soft compartments. Xray negative. Pt declined meds here, and declines crutches. Post op shoe placed for comfort. Discussed RICE therapy, rx for naprosyn and norco given, and will have her f/up with PCP for ongoing management. I explained the diagnosis and have given explicit precautions to return to the ER including for any other new or worsening symptoms. The patient understands  and accepts the medical plan as it's been dictated and I have answered their questions. Discharge instructions concerning home care and prescriptions have been given. The patient is STABLE and is discharged to home in good condition.  BP 144/81 mmHg  Pulse 68  Temp(Src) 98.4 F (36.9 C) (Oral)  Resp 19  Ht 6' (1.829 m)  Wt 260 lb (117.935 kg)  BMI 35.25 kg/m2  SpO2 99%  LMP 08/11/2014  Meds ordered this encounter  Medications  . naproxen (NAPROSYN) 500 MG tablet    Sig: Take 1 tablet (500 mg total) by mouth 2 (two) times daily as needed for mild pain, moderate pain or headache (TAKE WITH MEALS.).    Dispense:  20 tablet    Refill:  0    Order Specific Question:  Supervising Provider    Answer:  Noemi Chapel D [[id number]]  . HYDROcodone-acetaminophen (NORCO) 5-325 MG per tablet    Sig: Take 1-2 tablets by mouth every 6 (six) hours as needed for severe pain.    Dispense:  6 tablet    Refill:  0    Order Specific Question:  Supervising Provider    Answer:  Johnna Acosta [address] [place], PA-C 08/11/14 1825  Ernestina Patches, MD 08/12/14  [phone]

## 2014-08-11 NOTE — ED Notes (Signed)
2 weeks ago pt dropped a chair on her foot and pain has gotten worse over the last few days.

## 2014-08-11 NOTE — Discharge Instructions (Signed)
Ice your foot 20 minutes every hour, and elevate it to help with pain. Use the post op shoe to help with pain. Use crutches as needed for comfort. Follow up with your regular doctor as needed for ongoing pain. Return to the ER for changes or worsening symptoms.   Foot Contusion  A foot contusion is a deep bruise to the foot. Contusions happen when an injury causes bleeding under the skin. Signs of bruising include pain, puffiness (swelling), and discolored skin. The contusion may turn blue, purple, or yellow. HOME CARE  Put ice on the injured area.  Put ice in a plastic bag.  Place a towel between your skin and the bag.  Leave the ice on for 15-20 minutes, 03-04 times a day.  Only take medicines as told by your doctor.  Use an elastic wrap only as told. You may remove the wrap for sleeping, showering, and bathing. Take the wrap off if you lose feeling (numb) in your toes, or they turn blue or cold. Put the wrap on more loosely.  Keep the foot raised (elevated) with pillows.  If your foot hurts, avoid standing or walking.  When your doctor says it is okay to use your foot, start using it slowly. If you have pain, lessen how much you use your foot.  See your doctor as told. GET HELP RIGHT AWAY IF:   You have more redness, puffiness, or pain in your foot.  Your puffiness or pain does not get better with medicine.  You lose feeling in your foot, or you cannot move your toes.  Your foot turns cold or blue.  You have pain when you move your toes.  Your foot feels warm.  Your contusion does not get better in 2 days. MAKE SURE YOU:   Understand these instructions.  Will watch this condition.  Will get help right away if you or your child is not doing well or gets worse. Document Released: 03/17/2008 Document Revised: 12/08/2011 Document Reviewed: 05/12/2011 [facility] Patient Information 2015 [place], [place]. This information is not intended to replace advice given to you by  your health care provider. Make sure you discuss any questions you have with your health care provider.  Cryotherapy Cryotherapy is when you put ice on your injury. Ice helps lessen pain and puffiness (swelling) after an injury. Ice works the best when you start using it in the first 24 to 48 hours after an injury. HOME CARE  Put a dry or damp towel between the ice pack and your skin.  You may press gently on the ice pack.  Leave the ice on for no more than 10 to 20 minutes at a time.  Check your skin after 5 minutes to make sure your skin is okay.  Rest at least 20 minutes between ice pack uses.  Stop using ice when your skin loses feeling (numbness).  Do not use ice on someone who cannot tell you when it hurts. This includes small children and people with memory problems (dementia). GET HELP RIGHT AWAY IF:  You have white spots on your skin.  Your skin turns blue or pale.  Your skin feels waxy or hard.  Your puffiness gets worse. MAKE SURE YOU:   Understand these instructions.  Will watch your condition.  Will get help right away if you are not doing well or get worse. Document Released: 11/25/2007 Document Revised: 08/31/2011 Document Reviewed: 01/29/2011 [facility] Patient Information 2015 [place], [place]. This information is not intended to replace  advice given to you by your health care provider. Make sure you discuss any questions you have with your health care provider.

## 2015-04-25 DIAGNOSIS — Z4889 Encounter for other specified surgical aftercare: Secondary | ICD-10-CM | POA: Insufficient documentation

## 2015-06-16 ENCOUNTER — Emergency Department (HOSPITAL_BASED_OUTPATIENT_CLINIC_OR_DEPARTMENT_OTHER)
Admission: EM | Admit: 2015-06-16 | Discharge: 2015-06-16 | Disposition: A | Discharge status: Home / Self Care | Payer: Medicaid Other | Attending: Emergency Medicine | Admitting: Emergency Medicine

## 2015-06-16 ENCOUNTER — Encounter (HOSPITAL_BASED_OUTPATIENT_CLINIC_OR_DEPARTMENT_OTHER): Payer: Self-pay | Admitting: Emergency Medicine

## 2015-06-16 DIAGNOSIS — H6691 Otitis media, unspecified, right ear: Secondary | ICD-10-CM | POA: Insufficient documentation

## 2015-06-16 DIAGNOSIS — R590 Localized enlarged lymph nodes: Secondary | ICD-10-CM | POA: Diagnosis not present

## 2015-06-16 DIAGNOSIS — Z87891 Personal history of nicotine dependence: Secondary | ICD-10-CM | POA: Insufficient documentation

## 2015-06-16 DIAGNOSIS — Z79899 Other long term (current) drug therapy: Secondary | ICD-10-CM | POA: Diagnosis not present

## 2015-06-16 DIAGNOSIS — Z862 Personal history of diseases of the blood and blood-forming organs and certain disorders involving the immune mechanism: Secondary | ICD-10-CM | POA: Diagnosis not present

## 2015-06-16 DIAGNOSIS — Z8742 Personal history of other diseases of the female genital tract: Secondary | ICD-10-CM | POA: Insufficient documentation

## 2015-06-16 DIAGNOSIS — M1990 Unspecified osteoarthritis, unspecified site: Secondary | ICD-10-CM | POA: Insufficient documentation

## 2015-06-16 DIAGNOSIS — J45909 Unspecified asthma, uncomplicated: Secondary | ICD-10-CM | POA: Diagnosis not present

## 2015-06-16 DIAGNOSIS — Z8619 Personal history of other infectious and parasitic diseases: Secondary | ICD-10-CM | POA: Diagnosis not present

## 2015-06-16 DIAGNOSIS — Z793 Long term (current) use of hormonal contraceptives: Secondary | ICD-10-CM | POA: Diagnosis not present

## 2015-06-16 DIAGNOSIS — H9201 Otalgia, right ear: Secondary | ICD-10-CM | POA: Diagnosis present

## 2015-06-16 MED ORDER — AZITHROMYCIN 250 MG PO TABS: 250.0000 mg | ORAL_TABLET | Freq: Every day | ORAL | Status: DC

## 2015-06-16 NOTE — ED Notes (Signed)
Patient reports that she has had some sinus congestion over the last couple of days, today she woke up with pan to her right ear

## 2015-06-16 NOTE — Discharge Instructions (Signed)

## 2015-06-16 NOTE — ED Provider Notes (Signed)
CSN: [id number]     Arrival date & time 06/16/15  2154 History   First MD Initiated Contact with Patient 06/16/15 2333     Chief Complaint  Patient presents with  . Otalgia     Patient is a 39 y.o. female presenting with ear pain. The history is provided by the patient. No language interpreter was used.  Otalgia  Sarah Franklin is a 39 y.o. female who presents to the Emergency Department complaining of earache. She reports a couple of days of sinus congestion, sore throat described as razor blades in her throat. Today she woke up with pain in her right ear. The pain is worse with chewing, moving her mouth, swallowing. She denies any fevers, sneezing, cough, abdominal pain, vomiting, lower extremity edema. Symptoms are moderate, constant and worsening.  Past Medical History  Diagnosis Date  . Placenta previa antepartum, second trimester 2006    20-34 WKS  . Abnormal Pap smear 2001    COLPO DONE;LAST PAP 2011 WAS NORMAL  . Ovarian cyst 14 YOA  . Complication of anesthesia     NEED PATCH BEHIND EAR TO KEEP FROM GETTING SICK  . Arthritis     HAS HAD 4 KNEE SURGERY  . Infection     YEAST INFECTION;NOT FREQUENT  . Anemia     WITH PREGNANCY;FeSO4 SUPP IN PAST  . Migraines 10 YOA    INDERAL, TOPAMAX, AMTRIPTYLINE IN PAST  . Asthma     ALBUTEROL AS NEEDED; AROUND FALL/WINTER MONTHS  . Pregnant   . h/o rapid labor 02/28/2012    Previous labors of 4hrs and 1hr   Past Surgical History  Procedure Laterality Date  . Cholecystectomy    . Appendectomy  1991  . Wisdom tooth extraction  1997    ALL 4 EXTRACTED  . Dilation and curettage of uterus  2005  . Knee surgery  2001,2007,2009,2011  . Cholecystectomy  2005   Family History  Problem Relation Age of Onset  . Osteoporosis Mother   . Anemia Mother   . Depression Mother   . Osteoporosis Maternal Grandmother    Social History  Substance Use Topics  . Smoking status: Former Smoker    Quit date: 01/21/2011  . Smokeless  tobacco: Never Used  . Alcohol Use: No   OB History    Gravida Para Term Preterm AB TAB SAB Ectopic Multiple Living   6 3 3  3  3   3      Review of Systems  HENT: Positive for ear pain.   All other systems reviewed and are negative.     Allergies  Mustard; Other; Topamax; Augmentin; and Eggs or egg-derived products  Home Medications   Prior to Admission medications   Medication Sig Start Date End Date Taking? Authorizing Provider  azithromycin (ZITHROMAX) 250 MG tablet Take 1 tablet (250 mg total) by mouth daily. Take first 2 tablets together, then 1 every day until finished. 06/16/15   Quintella Reichert, MD  cyclobenzaprine (FLEXERIL) 10 MG tablet Take 10 mg by mouth 3 (three) times daily as needed for muscle spasms (2 tabs at night only).    Historical Provider, MD  HYDROcodone-acetaminophen (NORCO) 5-325 MG per tablet Take 1-2 tablets by mouth every 4 (four) hours as needed. 06/19/14   Elnora Morrison, MD  HYDROcodone-acetaminophen (NORCO) 5-325 MG per tablet Take 1-2 tablets by mouth every 6 (six) hours as needed for severe pain. 08/11/14   Mercedes Camprubi-Soms, PA-C  naproxen (NAPROSYN) 500 MG tablet Take 1  tablet (500 mg total) by mouth 2 (two) times daily as needed for mild pain, moderate pain or headache (TAKE WITH MEALS.). 08/11/14   Mercedes Camprubi-Soms, PA-C  norethindrone (ORTHO MICRONOR) 0.35 MG tablet Take 1 tablet by mouth daily.    Historical Provider, MD  Prenatal Vit-Fe Fumarate-FA (PRENATAL MULTIVITAMIN) TABS Take 1 tablet by mouth daily.    Historical Provider, MD   BP 152/83 mmHg  Pulse 64  Temp(Src) 98.4 F (36.9 C) (Oral)  Resp 18  Ht 5\' 11"  (1.803 m)  Wt 239 lb (108.41 kg)  BMI 33.35 kg/m2  SpO2 100%  LMP 06/06/2015 Physical Exam  Constitutional: She is oriented to person, place, and time. She appears well-developed and well-nourished.  HENT:  Head: Normocephalic and atraumatic.  Right TM with hemorrhage in the upper aspects, dull light reflex. Left TM  is clear and normal. No mastoid tenderness. Mild erythema in the posterior oropharynx.  Neck:  Mild anterior cervical lymphadenopathy bilaterally without any appreciable tenderness.  Cardiovascular: Normal rate and regular rhythm.   Pulmonary/Chest: Effort normal. No respiratory distress.  Musculoskeletal: She exhibits no edema or tenderness.  Neurological: She is alert and oriented to person, place, and time.  Skin: Skin is warm and dry.  Psychiatric: She has a normal mood and affect. Her behavior is normal.  Nursing note and vitals reviewed.   ED Course  Procedures (including critical care time) Labs Review Labs Reviewed - No data to display  Imaging Review No results found. I have personally reviewed and evaluated these images and lab results as part of my medical decision-making.   EKG Interpretation None      MDM   Final diagnoses:  Acute right otitis media, recurrence not specified, unspecified otitis media type    Patient here for evaluation of otalgia. Sam is concerning for acute otitis media. Patient has a penicillin allergy that results in hives. We will prescribe azithromycin for likely acute otitis media. Discussed ibuprofen and Tylenol for pain control, fluid hydration, outpatient follow-up to evaluate for resolution. Return precautions were discussed. She is nontoxic appearing on examination with no evidence of mastoiditis, PTA, RPA.    Quintella Reichert, MD 06/16/15 [phone]

## 2015-07-24 ENCOUNTER — Emergency Department (HOSPITAL_BASED_OUTPATIENT_CLINIC_OR_DEPARTMENT_OTHER)
Admission: EM | Admit: 2015-07-24 | Discharge: 2015-07-24 | Disposition: A | Discharge status: Home / Self Care | Payer: Medicaid Other | Attending: Emergency Medicine | Admitting: Emergency Medicine

## 2015-07-24 ENCOUNTER — Encounter (HOSPITAL_BASED_OUTPATIENT_CLINIC_OR_DEPARTMENT_OTHER): Payer: Self-pay

## 2015-07-24 ENCOUNTER — Emergency Department (HOSPITAL_BASED_OUTPATIENT_CLINIC_OR_DEPARTMENT_OTHER): Payer: Medicaid Other

## 2015-07-24 DIAGNOSIS — M1990 Unspecified osteoarthritis, unspecified site: Secondary | ICD-10-CM | POA: Diagnosis not present

## 2015-07-24 DIAGNOSIS — S60021A Contusion of right index finger without damage to nail, initial encounter: Secondary | ICD-10-CM | POA: Diagnosis not present

## 2015-07-24 DIAGNOSIS — S63610A Unspecified sprain of right index finger, initial encounter: Secondary | ICD-10-CM | POA: Diagnosis not present

## 2015-07-24 DIAGNOSIS — Y998 Other external cause status: Secondary | ICD-10-CM | POA: Diagnosis not present

## 2015-07-24 DIAGNOSIS — Y9301 Activity, walking, marching and hiking: Secondary | ICD-10-CM | POA: Insufficient documentation

## 2015-07-24 DIAGNOSIS — Z793 Long term (current) use of hormonal contraceptives: Secondary | ICD-10-CM | POA: Diagnosis not present

## 2015-07-24 DIAGNOSIS — S638X1A Sprain of other part of right wrist and hand, initial encounter: Secondary | ICD-10-CM | POA: Insufficient documentation

## 2015-07-24 DIAGNOSIS — Z862 Personal history of diseases of the blood and blood-forming organs and certain disorders involving the immune mechanism: Secondary | ICD-10-CM | POA: Diagnosis not present

## 2015-07-24 DIAGNOSIS — S60221A Contusion of right hand, initial encounter: Secondary | ICD-10-CM | POA: Diagnosis not present

## 2015-07-24 DIAGNOSIS — Z8679 Personal history of other diseases of the circulatory system: Secondary | ICD-10-CM | POA: Insufficient documentation

## 2015-07-24 DIAGNOSIS — Y9289 Other specified places as the place of occurrence of the external cause: Secondary | ICD-10-CM | POA: Diagnosis not present

## 2015-07-24 DIAGNOSIS — S6391XA Sprain of unspecified part of right wrist and hand, initial encounter: Secondary | ICD-10-CM

## 2015-07-24 DIAGNOSIS — S6991XA Unspecified injury of right wrist, hand and finger(s), initial encounter: Secondary | ICD-10-CM | POA: Diagnosis present

## 2015-07-24 DIAGNOSIS — W548XXA Other contact with dog, initial encounter: Secondary | ICD-10-CM | POA: Diagnosis not present

## 2015-07-24 DIAGNOSIS — Z8742 Personal history of other diseases of the female genital tract: Secondary | ICD-10-CM | POA: Insufficient documentation

## 2015-07-24 DIAGNOSIS — Z8619 Personal history of other infectious and parasitic diseases: Secondary | ICD-10-CM | POA: Diagnosis not present

## 2015-07-24 DIAGNOSIS — Z87891 Personal history of nicotine dependence: Secondary | ICD-10-CM | POA: Diagnosis not present

## 2015-07-24 DIAGNOSIS — Z79899 Other long term (current) drug therapy: Secondary | ICD-10-CM | POA: Diagnosis not present

## 2015-07-24 MED ORDER — HYDROCODONE-ACETAMINOPHEN 5-325 MG PO TABS: 1.0000 | ORAL_TABLET | Freq: Four times a day (QID) | ORAL | Status: DC | PRN

## 2015-07-24 MED ORDER — NAPROXEN 250 MG PO TABS
500.0000 mg | ORAL_TABLET | Freq: Once | ORAL | Status: AC
  Administered 2015-07-24: 500 mg via ORAL
  Filled 2015-07-24: qty 2

## 2015-07-24 NOTE — ED Notes (Signed)
Pt injured right hand while walking dog when he pulled suddenly on the leash.  C/o right index and thumb pain, no swelling or trauma appreciated.

## 2015-07-24 NOTE — ED Notes (Signed)
Pt verbalizes understanding of d/c instructions and denies any further needs at this time. 

## 2015-07-24 NOTE — ED Notes (Signed)
Patient returned from xray.

## 2015-07-24 NOTE — ED Provider Notes (Addendum)
CSN: [id number]     Arrival date & time 07/24/15  0056 History   First MD Initiated Contact with Patient 07/24/15 0201     Chief Complaint  Patient presents with  . Hand Injury     (Consider location/radiation/quality/duration/timing/severity/associated sxs/prior Treatment) HPI  This is a 40 year old female who was walking her dog about midnight when the dog tried to run away. She was holding the leash in her right hand. When the dog pulled away it injured her right hand. She is now having moderate to severe pain in her right hand. The pain is located in the right index finger PIP joint and the dorsal right hand proximal to the second and third MCP joints. There is associated swelling and mild ecchymosis. Pain is worse with palpation or movement of the right index finger or thumb. Movement of the right index finger is limited by pain. There is no associated bony deformity, sensory or motor deficit. She denies other injury.  Past Medical History  Diagnosis Date  . Placenta previa antepartum, second trimester 2006    20-34 WKS  . Abnormal Pap smear 2001    COLPO DONE;LAST PAP 2011 WAS NORMAL  . Ovarian cyst 14 YOA  . Complication of anesthesia     NEED PATCH BEHIND EAR TO KEEP FROM GETTING SICK  . Arthritis     HAS HAD 4 KNEE SURGERY  . Infection     YEAST INFECTION;NOT FREQUENT  . Anemia     WITH PREGNANCY;FeSO4 SUPP IN PAST  . Migraines 10 YOA    INDERAL, TOPAMAX, AMTRIPTYLINE IN PAST  . Asthma     ALBUTEROL AS NEEDED; AROUND FALL/WINTER MONTHS  . Pregnant   . h/o rapid labor 02/28/2012    Previous labors of 4hrs and 1hr   Past Surgical History  Procedure Laterality Date  . Cholecystectomy    . Appendectomy  1991  . Wisdom tooth extraction  1997    ALL 4 EXTRACTED  . Dilation and curettage of uterus  2005  . Knee surgery  2001,2007,2009,2011  . Cholecystectomy  2005   Family History  Problem Relation Age of Onset  . Osteoporosis Mother   . Anemia Mother   . Depression  Mother   . Osteoporosis Maternal Grandmother    Social History  Substance Use Topics  . Smoking status: Former Smoker    Quit date: 01/21/2011  . Smokeless tobacco: Never Used  . Alcohol Use: No   OB History    Gravida Para Term Preterm AB TAB SAB Ectopic Multiple Living   6 3 3  3  3   3      Review of Systems  All other systems reviewed and are negative.   Allergies  Mustard; Other; Topamax; Augmentin; and Eggs or egg-derived products  Home Medications   Prior to Admission medications   Medication Sig Start Date End Date Taking? Authorizing Provider  azithromycin (ZITHROMAX) 250 MG tablet Take 1 tablet (250 mg total) by mouth daily. Take first 2 tablets together, then 1 every day until finished. 06/16/15   Quintella Reichert, MD  cyclobenzaprine (FLEXERIL) 10 MG tablet Take 10 mg by mouth 3 (three) times daily as needed for muscle spasms (2 tabs at night only).    Historical Provider, MD  HYDROcodone-acetaminophen (NORCO) 5-325 MG per tablet Take 1-2 tablets by mouth every 4 (four) hours as needed. 06/19/14   Elnora Morrison, MD  HYDROcodone-acetaminophen (NORCO) 5-325 MG per tablet Take 1-2 tablets by mouth every 6 (six) hours  as needed for severe pain. 08/11/14   Mercedes Camprubi-Soms, PA-C  naproxen (NAPROSYN) 500 MG tablet Take 1 tablet (500 mg total) by mouth 2 (two) times daily as needed for mild pain, moderate pain or headache (TAKE WITH MEALS.). 08/11/14   Mercedes Camprubi-Soms, PA-C  norethindrone (ORTHO MICRONOR) 0.35 MG tablet Take 1 tablet by mouth daily.    Historical Provider, MD  Prenatal Vit-Fe Fumarate-FA (PRENATAL MULTIVITAMIN) TABS Take 1 tablet by mouth daily.    Historical Provider, MD   BP 142/95 mmHg  Pulse 73  Temp(Src) 98.4 F (36.9 C) (Oral)  Resp 18  Ht 5\' 11"  (1.803 m)  Wt 240 lb (108.863 kg)  BMI 33.49 kg/m2  SpO2 98%  LMP 07/03/2015   Physical Exam  General: Well-developed, well-nourished female in no acute distress; appearance consistent with  age of record HENT: normocephalic; atraumatic Eyes: pupils equal, round and reactive to light; extraocular muscles intact Neck: supple; nontender Heart: regular rate and rhythm Lungs: clear to auscultation bilaterally Abdomen: soft; nondistended; nontender; bowel sounds present Extremities: No deformity; full range of motion except right thumb and index finger due to pain; pulses normal; tenderness of dorsal right hand and index finger with swelling and ecchymosis:   Neurologic: Awake, alert and oriented; motor function intact in all extremities and symmetric; no facial droop Skin: Warm and dry Psychiatric: Normal mood and affect    ED Course  Procedures (including critical care time)   MDM  Nursing notes and vitals signs, including pulse oximetry, reviewed.  Summary of this visit's results, reviewed by myself:  Imaging Studies: Dg Hand Complete Right  2015/07/28  CLINICAL DATA:  Right hand pain after injury. Hand was twisted in a leash 1 hour ago. Pain and swelling about the thumb and index fingers, first through third metacarpals. EXAM: RIGHT HAND - COMPLETE 3+ VIEW COMPARISON:  None. FINDINGS: No fracture or dislocation. The alignment and joint spaces are maintained. Mild soft tissue edema of the index finger. IMPRESSION: Soft tissue edema.  No fracture or dislocation. Electronically Signed   By: Jeb Levering M.D.   On: 2015-07-28 01:52      Shanon Rosser, MD 2015-07-28 [id number]  Shanon Rosser, MD 07-28-2015 [phone]

## 2015-07-24 NOTE — ED Notes (Signed)
Patient transported to X-ray 

## 2015-08-02 ENCOUNTER — Encounter: Payer: Self-pay | Admitting: Family Medicine

## 2015-08-02 ENCOUNTER — Encounter (INDEPENDENT_AMBULATORY_CARE_PROVIDER_SITE_OTHER): Payer: Self-pay

## 2015-08-02 ENCOUNTER — Ambulatory Visit (HOSPITAL_BASED_OUTPATIENT_CLINIC_OR_DEPARTMENT_OTHER)
Admission: RE | Admit: 2015-08-02 | Discharge: 2015-08-02 | Disposition: A | Discharge status: Home / Self Care | Payer: Medicaid Other | Source: Ambulatory Visit | Attending: Family Medicine | Admitting: Family Medicine

## 2015-08-02 ENCOUNTER — Ambulatory Visit (INDEPENDENT_AMBULATORY_CARE_PROVIDER_SITE_OTHER): Payer: Medicaid Other | Admitting: Family Medicine

## 2015-08-02 VITALS — BP 141/83 | HR 68 | Ht 71.0 in | Wt 245.0 lb

## 2015-08-02 DIAGNOSIS — S6991XA Unspecified injury of right wrist, hand and finger(s), initial encounter: Secondary | ICD-10-CM

## 2015-08-02 DIAGNOSIS — X58XXXA Exposure to other specified factors, initial encounter: Secondary | ICD-10-CM | POA: Diagnosis not present

## 2015-08-02 DIAGNOSIS — S60021A Contusion of right index finger without damage to nail, initial encounter: Secondary | ICD-10-CM | POA: Diagnosis not present

## 2015-08-02 DIAGNOSIS — M79644 Pain in right finger(s): Secondary | ICD-10-CM | POA: Insufficient documentation

## 2015-08-02 NOTE — Patient Instructions (Signed)
You have a severe finger sprain. Buddy tape your fingers or use the splint for support. Icing 15 minutes at a time 3-4 times a day. Ibuprofen or aleve as needed for pain, inflammation. Follow up with me in 2 weeks for reevaluation.

## 2015-08-05 DIAGNOSIS — IMO0001 Reserved for inherently not codable concepts without codable children: Secondary | ICD-10-CM | POA: Insufficient documentation

## 2015-08-05 NOTE — Progress Notes (Signed)
PCP: No PCP Per Patient  Subjective:   HPI: Patient is a 40 y.o. female here for right finger injury.  Patient reports on 2/1 she was walking her dog - dog pulled leash and her right finger was stuck in this. Index finger twisted in this. Has been throbbing, aching with 5/10 level of pain since. + swelling and bruising. No prior injuries. No rash, fever.  Past Medical History  Diagnosis Date  . Placenta previa antepartum, second trimester 2006    20-34 WKS  . Abnormal Pap smear 2001    COLPO DONE;LAST PAP 2011 WAS NORMAL  . Ovarian cyst 12 YOA  . Complication of anesthesia     NEED PATCH BEHIND EAR TO KEEP FROM GETTING SICK  . Arthritis     HAS HAD 4 KNEE SURGERY  . Infection     YEAST INFECTION;NOT FREQUENT  . Anemia     WITH PREGNANCY;FeSO4 SUPP IN PAST  . Migraines 8 YOA    INDERAL, TOPAMAX, AMTRIPTYLINE IN PAST  . Asthma     ALBUTEROL AS NEEDED; AROUND FALL/WINTER MONTHS  . Pregnant   . h/o rapid labor 02/28/2012    Previous labors of 4hrs and 1hr    Current Outpatient Prescriptions on File Prior to Visit  Medication Sig Dispense Refill  . HYDROcodone-acetaminophen (NORCO) 5-325 MG tablet Take 1-2 tablets by mouth every 6 (six) hours as needed (for pain). 6 tablet 0  . norethindrone (ORTHO MICRONOR) 0.35 MG tablet Take 1 tablet by mouth daily.    . Prenatal Vit-Fe Fumarate-FA (PRENATAL MULTIVITAMIN) TABS Take 1 tablet by mouth daily.     No current facility-administered medications on file prior to visit.    Past Surgical History  Procedure Laterality Date  . Cholecystectomy    . Appendectomy  1991  . Wisdom tooth extraction  1997    ALL 4 EXTRACTED  . Dilation and curettage of uterus  2005  . Knee surgery  2001,2007,2009,2011  . Cholecystectomy  2005    Allergies  Allergen Reactions  . Madelaine Bhat Isothiocyanate] Anaphylaxis  . Other Anaphylaxis    Oranges Reaction:   . Topamax Hives and Shortness Of Breath  . Augmentin [Amoxicillin-Pot  Clavulanate] Nausea And Vomiting  . Eggs Or Egg-Derived Products Nausea And Vomiting    GI Upset  . Tape Rash    hives    Social History   Social History  . Marital Status: Married    Spouse Name: DAN Reino  . Number of Children: 2  . Years of Education: 16   Occupational History  . STUDENT    Social History Main Topics  . Smoking status: Former Smoker    Quit date: 01/21/2011  . Smokeless tobacco: Never Used  . Alcohol Use: No  . Drug Use: No  . Sexual Activity: Not on file   Other Topics Concern  . Not on file   Social History Narrative    Family History  Problem Relation Age of Onset  . Osteoporosis Mother   . Anemia Mother   . Depression Mother   . Osteoporosis Maternal Grandmother     BP 141/83 mmHg  Pulse 68  Ht 5\' 11"  (1.803 m)  Wt 245 lb (111.131 kg)  BMI 34.19 kg/m2  LMP 07/03/2015  Review of Systems: See HPI above.    Objective:  Physical Exam:  Gen: NAD  Right 2nd digit: Mod swelling, bruising throughout.  No malrotation or angulation.  TTP circumferentially about PIP joint. Able to flex and  extend at PIP, MCP, PIP joints - very stiff attempting flexion due to splinting. Collateral ligaments intact. NVI distally.  Left 2nd digit: FROM all joints without pain.    Assessment & Plan:  1. Right 2nd finger injury - 2/2 sprain.  Tendons intact on exam though will follow closely with reevaluation in 2 weeks.  Shown how to buddy tape or use splint for support, whichever feels more comfortable.  New splint provided today but with a little flexion at MCP, PIP, DIP joints.  Icing, nsaids as needed.

## 2015-08-05 NOTE — Assessment & Plan Note (Signed)
2/2 sprain.  Tendons intact on exam though will follow closely with reevaluation in 2 weeks.  Shown how to buddy tape or use splint for support, whichever feels more comfortable.  New splint provided today but with a little flexion at MCP, PIP, DIP joints.  Icing, nsaids as needed.

## 2015-08-16 ENCOUNTER — Encounter: Payer: Self-pay | Admitting: Family Medicine

## 2015-08-16 ENCOUNTER — Ambulatory Visit (INDEPENDENT_AMBULATORY_CARE_PROVIDER_SITE_OTHER): Payer: Medicaid Other | Admitting: Family Medicine

## 2015-08-16 VITALS — BP 103/72 | HR 80 | Ht 71.0 in | Wt 239.0 lb

## 2015-08-16 DIAGNOSIS — S6991XD Unspecified injury of right wrist, hand and finger(s), subsequent encounter: Secondary | ICD-10-CM

## 2015-08-16 NOTE — Patient Instructions (Signed)
Buddy tape your fingers when possible (index and middle). Use splint only as needed now. Icing as needed. Follow up with me in 4 weeks. If still not improving as expected would consider MRI.

## 2015-08-20 NOTE — Assessment & Plan Note (Signed)
Right 2nd finger injury - 2/2 sprain.  Tendons intact on exam, collateral ligaments as well.  Can buddy tape as needed.  Want her to work on motion now as most of pain likely related to stiffness.  Icing as needed.  F/u in 4 weeks for reevaluation.

## 2015-08-20 NOTE — Progress Notes (Signed)
PCP: No PCP Per Patient  Subjective:   HPI: Patient is a 40 y.o. female here for right finger injury.  2/10: Patient reports on 2/1 she was walking her dog - dog pulled leash and her right finger was stuck in this. Index finger twisted in this. Has been throbbing, aching with 5/10 level of pain since. + swelling and bruising. No prior injuries. No rash, fever.  2/24: Patient reports she's still having pain - at 4/10, sharp. Some swelling and bruising still as well. Using splint. States a  Couple times her PIP popped and bruised. No rash, fever.  Past Medical History  Diagnosis Date  . Placenta previa antepartum, second trimester 2006    20-34 WKS  . Abnormal Pap smear 2001    COLPO DONE;LAST PAP 2011 WAS NORMAL  . Ovarian cyst 12 YOA  . Complication of anesthesia     NEED PATCH BEHIND EAR TO KEEP FROM GETTING SICK  . Arthritis     HAS HAD 4 KNEE SURGERY  . Infection     YEAST INFECTION;NOT FREQUENT  . Anemia     WITH PREGNANCY;FeSO4 SUPP IN PAST  . Migraines 8 YOA    INDERAL, TOPAMAX, AMTRIPTYLINE IN PAST  . Asthma     ALBUTEROL AS NEEDED; AROUND FALL/WINTER MONTHS  . Pregnant   . h/o rapid labor 02/28/2012    Previous labors of 4hrs and 1hr    Current Outpatient Prescriptions on File Prior to Visit  Medication Sig Dispense Refill  . buPROPion (WELLBUTRIN) 75 MG tablet   2  . HYDROcodone-acetaminophen (NORCO) 5-325 MG tablet Take 1-2 tablets by mouth every 6 (six) hours as needed (for pain). 6 tablet 0  . norethindrone (ORTHO MICRONOR) 0.35 MG tablet Take 1 tablet by mouth daily.    . phentermine (ADIPEX-P) 37.5 MG tablet Take 37.5 mg by mouth.    . Prenatal Vit-Fe Fumarate-FA (PRENATAL MULTIVITAMIN) TABS Take 1 tablet by mouth daily.    . ranitidine (ZANTAC) 15 MG/ML syrup Take 150 mg by mouth.     No current facility-administered medications on file prior to visit.    Past Surgical History  Procedure Laterality Date  . Cholecystectomy    . Appendectomy   1991  . Wisdom tooth extraction  1997    ALL 4 EXTRACTED  . Dilation and curettage of uterus  2005  . Knee surgery  2001,2007,2009,2011  . Cholecystectomy  2005    Allergies  Allergen Reactions  . Madelaine Bhat Isothiocyanate] Anaphylaxis  . Other Anaphylaxis    Oranges Reaction:   . Topamax Hives and Shortness Of Breath  . Augmentin [Amoxicillin-Pot Clavulanate] Nausea And Vomiting  . Eggs Or Egg-Derived Products Nausea And Vomiting    GI Upset  . Tape Rash    hives    Social History   Social History  . Marital Status: Married    Spouse Name: DAN Test  . Number of Children: 2  . Years of Education: 16   Occupational History  . STUDENT    Social History Main Topics  . Smoking status: Former Smoker    Quit date: 01/21/2011  . Smokeless tobacco: Never Used  . Alcohol Use: No  . Drug Use: No  . Sexual Activity: Not on file   Other Topics Concern  . Not on file   Social History Narrative    Family History  Problem Relation Age of Onset  . Osteoporosis Mother   . Anemia Mother   . Depression Mother   .  Osteoporosis Maternal Grandmother     BP 103/72 mmHg  Pulse 80  Ht 5\' 11"  (1.803 m)  Wt 239 lb (108.41 kg)  BMI 33.35 kg/m2  LMP 07/03/2015  Review of Systems: See HPI above.    Objective:  Physical Exam:  Gen: NAD  Right 2nd digit: Mod swelling, bruising throughout.  No malrotation or angulation.  TTP circumferentially about PIP joint. Able to flex and extend at PIP, MCP, PIP joints - very stiff attempting flexion due to splinting. Collateral ligaments intact. NVI distally.  Left 2nd digit: FROM all joints without pain.    Assessment & Plan:  1. Right 2nd finger injury - 2/2 sprain.  Tendons intact on exam, collateral ligaments as well.  Can buddy tape as needed.  Want her to work on motion now as most of pain likely related to stiffness.  Icing as needed.  F/u in 4 weeks for reevaluation.

## 2015-09-13 ENCOUNTER — Ambulatory Visit: Payer: Medicaid Other | Admitting: Family Medicine

## 2016-01-09 ENCOUNTER — Emergency Department (HOSPITAL_BASED_OUTPATIENT_CLINIC_OR_DEPARTMENT_OTHER): Payer: Medicaid Other

## 2016-01-09 ENCOUNTER — Emergency Department (HOSPITAL_BASED_OUTPATIENT_CLINIC_OR_DEPARTMENT_OTHER)
Admission: EM | Admit: 2016-01-09 | Discharge: 2016-01-10 | Disposition: A | Discharge status: Home / Self Care | Payer: Medicaid Other | Attending: Emergency Medicine | Admitting: Emergency Medicine

## 2016-01-09 ENCOUNTER — Encounter (HOSPITAL_BASED_OUTPATIENT_CLINIC_OR_DEPARTMENT_OTHER): Payer: Self-pay

## 2016-01-09 DIAGNOSIS — W19XXXA Unspecified fall, initial encounter: Secondary | ICD-10-CM

## 2016-01-09 DIAGNOSIS — S80211A Abrasion, right knee, initial encounter: Secondary | ICD-10-CM | POA: Insufficient documentation

## 2016-01-09 DIAGNOSIS — Z87891 Personal history of nicotine dependence: Secondary | ICD-10-CM | POA: Diagnosis not present

## 2016-01-09 DIAGNOSIS — S53402A Unspecified sprain of left elbow, initial encounter: Secondary | ICD-10-CM | POA: Diagnosis not present

## 2016-01-09 DIAGNOSIS — J45909 Unspecified asthma, uncomplicated: Secondary | ICD-10-CM | POA: Insufficient documentation

## 2016-01-09 DIAGNOSIS — S63502A Unspecified sprain of left wrist, initial encounter: Secondary | ICD-10-CM | POA: Diagnosis not present

## 2016-01-09 DIAGNOSIS — Y9389 Activity, other specified: Secondary | ICD-10-CM | POA: Insufficient documentation

## 2016-01-09 DIAGNOSIS — S80219A Abrasion, unspecified knee, initial encounter: Secondary | ICD-10-CM

## 2016-01-09 DIAGNOSIS — Y929 Unspecified place or not applicable: Secondary | ICD-10-CM | POA: Insufficient documentation

## 2016-01-09 DIAGNOSIS — S59902A Unspecified injury of left elbow, initial encounter: Secondary | ICD-10-CM | POA: Diagnosis present

## 2016-01-09 DIAGNOSIS — W010XXA Fall on same level from slipping, tripping and stumbling without subsequent striking against object, initial encounter: Secondary | ICD-10-CM | POA: Insufficient documentation

## 2016-01-09 DIAGNOSIS — S80212A Abrasion, left knee, initial encounter: Secondary | ICD-10-CM | POA: Insufficient documentation

## 2016-01-09 DIAGNOSIS — Y999 Unspecified external cause status: Secondary | ICD-10-CM | POA: Insufficient documentation

## 2016-01-09 DIAGNOSIS — M1990 Unspecified osteoarthritis, unspecified site: Secondary | ICD-10-CM | POA: Insufficient documentation

## 2016-01-09 NOTE — Discharge Instructions (Signed)
Apply ice to affected areas for 20 minutes every 2 hours while awake for the next 2 days.  Local wound care with bacitracin and dressing changes twice daily for the next several days.  Ibuprofen 600 mg every 6 hours as needed for pain.   Wrist Sprain A wrist sprain is a stretch or tear in the strong, fibrous tissues (ligaments) that connect your wrist bones. The ligaments of your wrist may be easily sprained. There are three types of wrist sprains.  Grade 1. The ligament is not stretched or torn, but the sprain causes pain.  Grade 2. The ligament is stretched or partially torn. You may be able to move your wrist, but not very much.  Grade 3. The ligament or muscle completely tears. You may find it difficult or extremely painful to move your wrist even a little. CAUSES Often, wrist sprains are a result of a fall or an injury. The force of the impact causes the fibers of your ligament to stretch too much or tear. Common causes of wrist sprains include:  Overextending your wrist while catching a ball with your hands.  Repetitive or strenuous extension or bending of your wrist.  Landing on your hand during a fall. RISK FACTORS  Having previous wrist injuries.  Playing contact sports, such as boxing or wrestling.  Participating in activities in which falling is common.  Having poor wrist strength and flexibility. SIGNS AND SYMPTOMS  Wrist pain.  Wrist tenderness.  Inflammation or bruising of the wrist area.  Hearing a "pop" or feeling a tear at the time of the injury.  Decreased wrist movement due to pain, stiffness, or weakness. DIAGNOSIS Your health care provider will examine your wrist. In some cases, an X-ray will be taken to make sure you did not break any bones. If your health care provider thinks that you tore a ligament, he or she may order an MRI of your wrist. TREATMENT Treatment involves resting and icing your wrist. You may also need to take pain medicines to help  lessen pain and inflammation. Your health care provider may recommend keeping your wrist still (immobilized) with a splint to help your sprain heal. When the splint is no longer necessary, you may need to perform strengthening and stretching exercises. These exercises help you to regain strength and full range of motion in your wrist. Surgery is not usually needed for wrist sprains unless the ligament completely tears. HOME CARE INSTRUCTIONS  Rest your wrist. Do not do things that cause pain.  Wear your wrist splint as directed by your health care provider.  Take medicines only as directed by your health care provider.  To ease pain and swelling, apply ice to the injured area.  Put ice in a plastic bag.  Place a towel between your skin and the bag.  Leave the ice on for 20 minutes, 2-3 times a day. SEEK MEDICAL CARE IF:  Your pain, discomfort, or swelling gets worse even with treatment.  You feel sudden numbness in your hand.   This information is not intended to replace advice given to you by your health care provider. Make sure you discuss any questions you have with your health care provider.   Document Released: 02/09/2014 Document Reviewed: 02/09/2014 Elsevier Interactive Patient Education 2016 [place] An abrasion is a cut or scrape on the outer surface of your skin. An abrasion does not extend through all of the layers of your skin. It is important to care for your abrasion  properly to prevent infection. CAUSES Most abrasions are caused by falling on or gliding across the ground or another surface. When your skin rubs on something, the outer and inner layer of skin rubs off.  SYMPTOMS A cut or scrape is the main symptom of this condition. The scrape may be bleeding, or it may appear red or pink. If there was an associated fall, there may be an underlying bruise. DIAGNOSIS An abrasion is diagnosed with a physical exam. TREATMENT Treatment for this condition  depends on how large and deep the abrasion is. Usually, your abrasion will be cleaned with water and mild soap. This removes any dirt or debris that may be stuck. An antibiotic ointment may be applied to the abrasion to help prevent infection. A bandage (dressing) may be placed on the abrasion to keep it clean. You may also need a tetanus shot. HOME CARE INSTRUCTIONS Medicines  Take or apply medicines only as directed by your health care provider.  If you were prescribed an antibiotic ointment, finish all of it even if you start to feel better. Wound Care  Clean the wound with mild soap and water 2-3 times per day or as directed by your health care provider. Pat your wound dry with a clean towel. Do not rub it.  There are many different ways to close and cover a wound. Follow instructions from your health care provider about:  Wound care.  Dressing changes and removal.  Check your wound every day for signs of infection. Watch for:  Redness, swelling, or pain.  Fluid, blood, or pus. General Instructions  Keep the dressing dry as directed by your health care provider. Do not take baths, swim, use a hot tub, or do anything that would put your wound underwater until your health care provider approves.  If there is swelling, raise (elevate) the injured area above the level of your heart while you are sitting or lying down.  Keep all follow-up visits as directed by your health care provider. This is important. SEEK MEDICAL CARE IF:  You received a tetanus shot and you have swelling, severe pain, redness, or bleeding at the injection site.  Your pain is not controlled with medicine.  You have increased redness, swelling, or pain at the site of your wound. SEEK IMMEDIATE MEDICAL CARE IF:  You have a red streak going away from your wound.  You have a fever.  You have fluid, blood, or pus coming from your wound.  You notice a bad smell coming from your wound or your dressing.     This information is not intended to replace advice given to you by your health care provider. Make sure you discuss any questions you have with your health care provider.   Document Released: 03/18/2005 Document Revised: 02/27/2015 Document Reviewed: 06/06/2014 Elsevier Interactive Patient Education [employer].

## 2016-01-09 NOTE — ED Notes (Signed)
MD at bedside. 

## 2016-01-09 NOTE — ED Notes (Signed)
Tripped/fell this pm-pain to left arm, both knees, right great toe-presents to triage in w/c-NAD-playing video game on phone

## 2016-01-09 NOTE — ED Provider Notes (Signed)
CSN: [id number]     Arrival date & time 01/09/16  2228 History   By signing my name below, I, Maud Deed. Royston Sinner, attest that this documentation has been prepared under the direction and in the presence of Veryl Speak, MD.  Electronically Signed: Maud Deed. Royston Sinner, ED Scribe. 01/09/2016. 11:37 PM.   Chief Complaint  Patient presents with  . Fall   The history is provided by the patient. No language interpreter was used.    HPI Comments: Sarah Franklin is a 40 y.o. female with a PMHx of arthritis who presents to the Emergency Department here after a fall sustained just prior to arrival. Pt states she tripped over a trailer hitch resulting in her landing on her knees bilaterally. Pt outstretched both arms to attempt to catch her fall. She was able to ambulate after incident. No head trauma or LOC. She now reports constant, unchanged L arm pain. Discomfort is exacerbated with movement without any alleviating factors. She also reports abrasions to both knees and to her R great toe. No OTC medications attempted prior to arrival. Denies any extensive interventions attempted for wounds after fall. No recent fever or chills.  PCP: Daylene Posey, FNP    Past Medical History  Diagnosis Date  . Placenta previa antepartum, second trimester 2006    20-34 WKS  . Abnormal Pap smear 2001    COLPO DONE;LAST PAP 2011 WAS NORMAL  . Ovarian cyst 12 YOA  . Complication of anesthesia     NEED PATCH BEHIND EAR TO KEEP FROM GETTING SICK  . Arthritis     HAS HAD 4 KNEE SURGERY  . Infection     YEAST INFECTION;NOT FREQUENT  . Anemia     WITH PREGNANCY;FeSO4 SUPP IN PAST  . Migraines 8 YOA    INDERAL, TOPAMAX, AMTRIPTYLINE IN PAST  . Asthma     ALBUTEROL AS NEEDED; AROUND FALL/WINTER MONTHS  . Pregnant   . h/o rapid labor 02/28/2012    Previous labors of 4hrs and 1hr   Past Surgical History  Procedure Laterality Date  . Cholecystectomy    . Appendectomy  1991  . Wisdom tooth extraction  1997    ALL  4 EXTRACTED  . Dilation and curettage of uterus  2005  . Knee surgery  2001,2007,2009,2011  . Cholecystectomy  2005   Family History  Problem Relation Age of Onset  . Osteoporosis Mother   . Anemia Mother   . Depression Mother   . Osteoporosis Maternal Grandmother    Social History  Substance Use Topics  . Smoking status: Former Smoker    Quit date: 01/21/2011  . Smokeless tobacco: Never Used  . Alcohol Use: No   OB History    Gravida Para Term Preterm AB TAB SAB Ectopic Multiple Living   6 3 3  3  3   3      Review of Systems  Constitutional: Negative for fever and chills.  Musculoskeletal: Positive for arthralgias.  Skin: Positive for wound.  All other systems reviewed and are negative.     Allergies  Mustard; Other; Topamax; Augmentin; Eggs or egg-derived products; and Tape  Home Medications   Prior to Admission medications   Medication Sig Start Date End Date Taking? Authorizing Provider  phentermine (ADIPEX-P) 37.5 MG tablet Take 37.5 mg by mouth. 07/12/15   Historical Provider, MD  ranitidine (ZANTAC) 15 MG/ML syrup Take 150 mg by mouth. 07/22/15 09/20/15  Historical Provider, MD   Triage Vitals: BP 141/88 mmHg  Pulse  69  Temp(Src) 98.3 F (36.8 C) (Oral)  Resp 20  Ht 5\' 11"  (1.803 m)  Wt 239 lb (108.41 kg)  BMI 33.35 kg/m2  SpO2 100%  LMP 12/08/2015   Physical Exam  Constitutional: She is oriented to person, place, and time. She appears well-developed and well-nourished.  HENT:  Head: Normocephalic.  Eyes: EOM are normal.  Neck: Normal range of motion.  Cardiovascular: Normal rate, regular rhythm and normal heart sounds.   Pulmonary/Chest: Effort normal and breath sounds normal.  Abdominal: She exhibits no distension.  Musculoskeletal: Normal range of motion.  The LUE appears grossly normal without swelling or deformity. Ulnar and radial pulses are palpable. She is able to flex, extend, and oppose all fingers. Sensation is intact throughout the  entire hand.  Both knees have abrasions with no active bleeding.  Neurological: She is alert and oriented to person, place, and time.  Psychiatric: She has a normal mood and affect.  Nursing note and vitals reviewed.   ED Course  Procedures (including critical care time)  DIAGNOSTIC STUDIES: Oxygen Saturation is 100% on RA, Normal by my interpretation.    COORDINATION OF CARE: 11:34 PM- Will order imaging. Discussed treatment plan with pt at bedside and pt agreed to plan.   Labs Review Labs Reviewed - No data to display  Imaging Review Dg Elbow Complete Left  01/09/2016  CLINICAL DATA:  Fall with diffuse left arm pain.  Initial encounter. EXAM: LEFT ELBOW - COMPLETE 3+ VIEW COMPARISON:  None. FINDINGS: There is no evidence of fracture, dislocation, or joint effusion. IMPRESSION: Negative. Electronically Signed   By: Monte Fantasia M.D.   On: 01/09/2016 23:18   Dg Wrist Complete Left  01/09/2016  CLINICAL DATA:  Fall.  Diffuse left arm pain.  Initial encounter. EXAM: LEFT WRIST - COMPLETE 3+ VIEW COMPARISON:  None. FINDINGS: There is no evidence of fracture or dislocation. There is no evidence of arthropathy or other focal bone abnormality. Soft tissues are unremarkable. IMPRESSION: Negative. Electronically Signed   By: Monte Fantasia M.D.   On: 01/09/2016 23:17   I have personally reviewed and evaluated these images and lab results as part of my medical decision-making.   EKG Interpretation None      MDM   Final diagnoses:  None    X-rays are negative. She will be treated for sprains, contusions, and abrasions. To return as needed for any problems.  I personally performed the services described in this documentation, which was scribed in my presence. The recorded information has been reviewed and is accurate.       Veryl Speak, MD 01/10/16 [phone]

## 2016-10-06 DIAGNOSIS — J45909 Unspecified asthma, uncomplicated: Secondary | ICD-10-CM | POA: Diagnosis not present

## 2016-10-06 DIAGNOSIS — M25532 Pain in left wrist: Secondary | ICD-10-CM | POA: Diagnosis present

## 2016-10-06 DIAGNOSIS — Z87891 Personal history of nicotine dependence: Secondary | ICD-10-CM | POA: Insufficient documentation

## 2016-10-06 DIAGNOSIS — M25511 Pain in right shoulder: Secondary | ICD-10-CM | POA: Insufficient documentation

## 2016-10-06 DIAGNOSIS — M25512 Pain in left shoulder: Secondary | ICD-10-CM | POA: Insufficient documentation

## 2016-10-06 DIAGNOSIS — M778 Other enthesopathies, not elsewhere classified: Secondary | ICD-10-CM | POA: Diagnosis not present

## 2016-10-06 DIAGNOSIS — G8929 Other chronic pain: Secondary | ICD-10-CM | POA: Diagnosis not present

## 2016-10-07 ENCOUNTER — Emergency Department (HOSPITAL_BASED_OUTPATIENT_CLINIC_OR_DEPARTMENT_OTHER)
Admission: EM | Admit: 2016-10-07 | Discharge: 2016-10-07 | Disposition: A | Discharge status: Home / Self Care | Payer: Medicaid Other | Attending: Emergency Medicine | Admitting: Emergency Medicine

## 2016-10-07 ENCOUNTER — Encounter (HOSPITAL_BASED_OUTPATIENT_CLINIC_OR_DEPARTMENT_OTHER): Payer: Self-pay | Admitting: Emergency Medicine

## 2016-10-07 DIAGNOSIS — M25511 Pain in right shoulder: Secondary | ICD-10-CM

## 2016-10-07 DIAGNOSIS — M778 Other enthesopathies, not elsewhere classified: Secondary | ICD-10-CM

## 2016-10-07 DIAGNOSIS — G8929 Other chronic pain: Secondary | ICD-10-CM

## 2016-10-07 DIAGNOSIS — M25512 Pain in left shoulder: Secondary | ICD-10-CM

## 2016-10-07 MED ORDER — HYDROCODONE-ACETAMINOPHEN 5-325 MG PO TABS: 2.0000 | ORAL_TABLET | Freq: Four times a day (QID) | ORAL | 0 refills | Status: AC | PRN

## 2016-10-07 MED ORDER — IBUPROFEN 800 MG PO TABS: 800.0000 mg | ORAL_TABLET | Freq: Three times a day (TID) | ORAL | 0 refills | Status: DC | PRN

## 2016-10-07 NOTE — ED Provider Notes (Signed)
TIME SEEN: 3:48 AM  CHIEF COMPLAINT: Bilateral shoulder pain, left wrist pain  HPI: Patient is a 41 year old female who is right-hand-dominant who presents emergency department 3 months of bilateral shoulder pain worse with movement and one week of left wrist pain. No known injury. 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 She states pain is worse with flexion and extension of the wrist and lifting her shoulders above her head. She has not followed up with orthopedic physician. She has not had any injury, fall. No numbness, tingling or focal weakness.  ROS: See HPI Constitutional: no fever  Eyes: no drainage  ENT: no runny nose   Cardiovascular:  no chest pain  Resp: no SOB  GI: no vomiting GU: no dysuria Integumentary: no rash  Allergy: no hives  Musculoskeletal: no leg swelling  Neurological: no slurred speech ROS otherwise negative  PAST MEDICAL HISTORY/PAST SURGICAL HISTORY:  Past Medical History:  Diagnosis Date  . Abnormal Pap smear 2001   COLPO DONE;LAST PAP 2011 WAS NORMAL  . Anemia    WITH PREGNANCY;FeSO4 SUPP IN PAST  . Arthritis    HAS HAD 4 KNEE SURGERY  . Asthma    ALBUTEROL AS NEEDED; AROUND FALL/WINTER MONTHS  . Complication of anesthesia    NEED PATCH BEHIND EAR TO KEEP FROM GETTING SICK  . h/o rapid labor 02/28/2012   Previous labors of 4hrs and 1hr  . Infection    YEAST INFECTION;NOT FREQUENT  . Migraines 8 YOA   INDERAL, TOPAMAX, AMTRIPTYLINE IN PAST  . Ovarian cyst 14 YOA  . Placenta previa antepartum, second trimester 2006   20-34 WKS  . Pregnant     MEDICATIONS:  Prior to Admission medications   Medication Sig Start Date End Date Taking? Authorizing Provider  phentermine (ADIPEX-P) 37.5 MG tablet Take 37.5 mg by mouth. 07/12/15   Historical Provider, MD  ranitidine (ZANTAC) 15 MG/ML syrup Take 150 mg by mouth. 07/22/15 09/20/15  Historical Provider, MD    ALLERGIES:  Allergies   Allergen Reactions  . Madelaine Bhat Isothiocyanate] Anaphylaxis  . Other Anaphylaxis    Oranges Reaction:   . Topamax Hives and Shortness Of Breath  . Augmentin [Amoxicillin-Pot Clavulanate] Nausea And Vomiting  . Eggs Or Egg-Derived Products Nausea And Vomiting    GI Upset  . Tape Rash    hives    SOCIAL HISTORY:  Social History  Substance Use Topics  . Smoking status: Former Smoker    Quit date: 01/21/2011  . Smokeless tobacco: Never Used  . Alcohol use No    FAMILY HISTORY: Family History  Problem Relation Age of Onset  . Osteoporosis Mother   . Anemia Mother   . Depression Mother   . Osteoporosis Maternal Grandmother     EXAM: BP (!) 149/78 (BP Location: Left Arm)   Pulse 78   Temp 98.8 F (37.1 C) (Oral)   Resp 18   Ht 5\' 11"  (1.803 m)   Wt 270 lb (122.5 kg)   LMP 09/20/2016   SpO2 96%   BMI 37.66 kg/m  CONSTITUTIONAL: Alert and oriented and responds appropriately to questions. Well-appearing; well-nourished HEAD: Normocephalic EYES: Conjunctivae clear, pupils appear equal, EOMI ENT: normal nose; moist mucous membranes NECK: Supple, no meningismus, no nuchal rigidity, no LAD  CARD: RRR; S1 and S2 appreciated; no murmurs, no clicks, no rubs, no gallops RESP: Normal chest excursion without splinting or tachypnea; breath sounds clear and equal bilaterally;  no wheezes, no rhonchi, no rales, no hypoxia or respiratory distress, speaking full sentences ABD/GI: Normal bowel sounds; non-distended; soft, non-tender, no rebound, no guarding, no peritoneal signs, no hepatosplenomegaly BACK:  The back appears normal and is non-tender to palpation, there is no CVA tenderness EXT: Tender over the deltoid of both shoulders without any deformity. No loss of fullness of the shoulders. Unable to lift her arms past 90. No erythema, warmth or Joan effusions. Compartment are all soft. 2+ radial pulses bilaterally. Tender diffusely throughout the left wrist without bony deformity.  Difficulty flexing and extending the wrist secondary to pain. Patient is able to make a fist. Full range of motion in the fingers. Otherwise Normal ROM in all joints; otherwise extremity is are non-tender to palpation; no edema; normal capillary refill; no cyanosis, no calf tenderness or swelling    SKIN: Normal color for age and race; warm; no rash NEURO: Moves all extremities equally, sensation to light touch intact diffusely throughout all 4 extremities PSYCH: The patient's mood and manner are appropriate. Grooming and personal hygiene are appropriate.  MEDICAL DECISION MAKING: Patient here with complains of bilateral shoulder pain and left wrist pain. Suspect tendinitis. No injury to suggest fracture and a do not feel she needs emergent x-rays. No signs of septic arthritis, gout, DVT, arterial obstruction, compartment syndrome. Have recommended alternating heat and ice, ibuprofen for pain control. Will also discharge with prescription of Vicodin for pain. We'll get outpatient orthopedic follow-up information as symptoms are not improving. We'll placed in a wrist splint or comfort.   At this time, I do not feel there is any life-threatening condition present. I have reviewed and discussed all results (EKG, imaging, lab, urine as appropriate) and exam findings with patient/family. I have reviewed nursing notes and appropriate previous records.  I feel the patient is safe to be discharged home without further emergent workup and can continue workup as an outpatient as needed. Discussed usual and customary return precautions. Patient/family verbalize understanding and are comfortable with this plan.  Outpatient follow-up has been provided if needed. All questions have been answered.         [place], DO 10/07/16 [phone]

## 2016-10-07 NOTE — ED Notes (Signed)
c/o left wrist pain x 1 week pain increased w touch Worse tonight, bilateral shoulder pain x 3 months, denies inj

## 2016-10-07 NOTE — ED Triage Notes (Signed)
Patient states that she has had pain to her bilateral shoulders x 3 months. The patient reports that over the last week she also started to have right wrist pain. No noted injury

## 2017-01-25 ENCOUNTER — Ambulatory Visit: Payer: Self-pay | Admitting: General Surgery

## 2017-01-25 NOTE — H&P (Signed)
History of Present Illness Sarah Franklin; 01/25/2017 11:05 AM) The patient is a 41 year old female who presents with an incisional hernia. Referred by: Daylene Posey, FNP Chief Complaint: Recurrent umbilical hernia  Patient is a 41 year old female who states that she had a previous open umbilical hernia repair that was primarily repaired multiple years ago. She states that over the last 2-3 months she noticed pain to the umbilical area and has noticed a bulge. She states that she does have pain when she lays on it. She states that is sharp. She has no other modifying factors.  Patient states that she is going to have right wrist surgery in the near future. She states that she is in a bariatric track in the future.  She has not had any imaging of the recurrent hernia recently.    Past Surgical History Malachy Moan, [place]; 01/25/2017 10:37 AM) Appendectomy  Gallbladder Surgery - Laparoscopic  Hemorrhoidectomy  Knee Surgery  Right. Oral Surgery  Ventral / Umbilical Hernia Surgery  Left.  Diagnostic Studies History Malachy Moan, [place]; 01/25/2017 10:37 AM) Colonoscopy  never Mammogram  1-3 years ago Pap Smear  1-5 years ago  Allergies Malachy Moan, RMA; 01/25/2017 10:38 AM) Allyl Isothiocyanate *CHEMICALS*  Amoxicillin-Pot Clavulanate *PENICILLINS*  Tape 1"X5yd *MEDICAL DEVICES AND SUPPLIES*  Topamax *ANTICONVULSANTS*   Medication History Malachy Moan, RMA; 01/25/2017 10:39 AM) Lipitor (Oral) Specific strength unknown - Active. Wellbutrin (Oral) Specific strength unknown - Active. TraZODone HCl (Oral) Specific strength unknown - Active. Medications Reconciled  Social History Malachy Moan, [place]; 01/25/2017 10:37 AM) Alcohol use  Moderate alcohol use. Caffeine use  Coffee, Tea. No drug use  Tobacco use  Former smoker.  Family History Malachy Moan, [place]; 01/25/2017 10:37 AM) Arthritis  Mother. Breast Cancer  Mother. Depression   Mother.  Pregnancy / Birth History Malachy Moan, [place]; 01/25/2017 10:37 AM) Age at menarche  49 years. Gravida  6 Length (months) of breastfeeding  >65 Maternal age  71-25 Para  3 Regular periods   Other Problems Malachy Moan, [place]; 01/25/2017 10:37 AM) Arthritis  Back Pain  Cholelithiasis  Hemorrhoids  Migraine Headache  Umbilical Hernia Repair     Review of Systems Sarah Franklin; 01/25/2017 11:04 AM) General Present- Weight Gain. Not Present- Appetite Loss, Chills, Fatigue, Fever, Night Sweats and Weight Loss. Skin Not Present- Change in Wart/Mole, Dryness, Hives, Jaundice, New Lesions, Non-Healing Wounds, Rash and Ulcer. HEENT Present- Wears glasses/contact lenses. Not Present- Earache, Hearing Loss, Hoarseness, Nose Bleed, Oral Ulcers, Ringing in the Ears, Seasonal Allergies, Sinus Pain, Sore Throat, Visual Disturbances and Yellow Eyes. Respiratory Not Present- Bloody sputum, Chronic Cough, Difficulty Breathing, Snoring and Wheezing. Breast Not Present- Breast Mass, Breast Pain, Nipple Discharge and Skin Changes. Cardiovascular Not Present- Chest Pain, Difficulty Breathing Lying Down, Leg Cramps, Palpitations, Rapid Heart Rate, Shortness of Breath and Swelling of Extremities. Gastrointestinal Present- Abdominal Pain. Not Present- Bloating, Bloody Stool, Change in Bowel Habits, Chronic diarrhea, Constipation, Difficulty Swallowing, Excessive gas, Gets full quickly at meals, Hemorrhoids, Indigestion, Nausea, Rectal Pain and Vomiting. Female Genitourinary Not Present- Frequency, Nocturia, Painful Urination, Pelvic Pain and Urgency. Musculoskeletal Present- Back Pain, Joint Pain and Joint Stiffness. Not Present- Muscle Pain, Muscle Weakness and Swelling of Extremities. Neurological Present- Headaches. Not Present- Decreased Memory, Fainting, Numbness, Seizures, Tingling, Tremor, Trouble walking and Weakness. Psychiatric Not Present- Anxiety, Bipolar, Change in Sleep  Pattern, Depression, Fearful and Frequent crying. Endocrine Not Present- Cold Intolerance, Excessive Hunger, Hair Changes, Heat Intolerance, Hot flashes and New Diabetes. Hematology  Present- Easy Bruising. Not Present- Blood Thinners, Excessive bleeding, Gland problems, HIV and Persistent Infections. All other systems negative  Vitals Malachy Moan RMA; 01/25/2017 10:40 AM) 01/25/2017 10:39 AM Weight: 285.8 lb Height: 70in Body Surface Area: 2.43 m Body Mass Index: 41.01 kg/m  Temp.: 97.82F  Pulse: 66 (Regular)  BP: 140/80 (Sitting, Left Arm, Standard)       Physical Exam Sarah Franklin; 01/25/2017 11:06 AM) The physical exam findings are as follows: Note:Constitutional: No acute distress, conversant, appears stated age  Eyes: Anicteric sclerae, moist conjunctiva, no lid lag  Neck: No thyromegaly, trachea midline, no cervical lymphadenopathy  Lungs: Clear to auscultation biilaterally, normal respiratory effot  Cardiovascular: regular rate & rhythm, no murmurs, no peripheal edema, pedal pulses 2+  GI: Soft, no masses or hepatosplenomegaly, non-tender to palpation  MSK: Normal gait, no clubbing cyanosis, edema  Skin: No rashes, palpation reveals normal skin turgor  Psychiatric: Appropriate judgment and insight, oriented to person, place, and time  Abdomen Inspection Hernias - Incisional - Reducible(A left periumbilical recurrent hernia. There to palpation.).    Assessment & Plan Sarah Franklin; 01/25/2017 11:08 AM) RECURRENT INCISIONAL HERNIA (K43.2) Impression: 41 year old female with a history of hypertension and hypercholesterolemia. She comes in today with a recurrent incisional hernia at her umbilicus. We will obtain a CT scan to evaluate the entire hernia.  1. The patient will like to proceed to the operating room for laparoscopic umbilical hernia repair with mesh.  2. I discussed with the patient the signs and symptoms of incarceration and  strangulation and the need to proceed to the ER should they occur.  3. I discussed with the patient the risks and benefits of the procedure to include but not limited to: Infection, bleeding, damage to surrounding structures, possible need for further surgery, possible nerve pain, and possible recurrence. The patient was understanding and wishes to proceed.

## 2017-01-29 ENCOUNTER — Other Ambulatory Visit: Payer: Self-pay | Admitting: General Surgery

## 2017-01-29 DIAGNOSIS — K432 Incisional hernia without obstruction or gangrene: Secondary | ICD-10-CM

## 2017-02-02 ENCOUNTER — Ambulatory Visit
Admission: RE | Admit: 2017-02-02 | Discharge: 2017-02-02 | Disposition: A | Discharge status: Home / Self Care | Payer: Medicaid Other | Source: Ambulatory Visit | Attending: General Surgery | Admitting: General Surgery

## 2017-02-02 DIAGNOSIS — K432 Incisional hernia without obstruction or gangrene: Secondary | ICD-10-CM

## 2017-03-09 NOTE — Patient Instructions (Signed)
Sarah Franklin  03/09/2017   Your procedure is scheduled on: 03-17-17  Report to [facility] Main  Entrance Take [place]  elevators to 3rd floor to  [place] at [phone].   Call this number if you have problems the morning of surgery [phone]    Remember: ONLY 1 PERSON MAY GO WITH YOU TO SHORT STAY TO GET  READY MORNING OF YOUR SURGERY.  Do not eat food or drink liquids :After Midnight.     Take these medicines the morning of surgery with A SIP OF WATER: hydrocodone if needed                                You may not have any metal on your body including hair pins and              piercings  Do not wear jewelry, make-up, lotions, powders or perfumes, deodorant             Do not wear nail polish.  Do not shave  48 hours prior to surgery.             Do not bring valuables to the hospital. [place].  Contacts, dentures or bridgework may not be worn into surgery.      Patients discharged the day of surgery will not be allowed to drive home.  Name and phone number of your driver:  Special Instructions: N/A              Please read over the following fact sheets you were given: _____________________________________________________________________           [facility] - Preparing for Surgery Before surgery, you can play an important role.  Because skin is not sterile, your skin needs to be as free of germs as possible.  You can reduce the number of germs on your skin by washing with CHG (chlorahexidine gluconate) soap before surgery.  CHG is an antiseptic cleaner which kills germs and bonds with the skin to continue killing germs even after washing. Please DO NOT use if you have an allergy to CHG or antibacterial soaps.  If your skin becomes reddened/irritated stop using the CHG and inform your nurse when you arrive at Short Stay. Do not shave (including legs and underarms) for at least 48 hours  prior to the first CHG shower.  You may shave your face/neck. Please follow these instructions carefully:  1.  Shower with CHG Soap the night before surgery and the  morning of Surgery.  2.  If you choose to wash your hair, wash your hair first as usual with your  normal  shampoo.  3.  After you shampoo, rinse your hair and body thoroughly to remove the  shampoo.                           4.  Use CHG as you would any other liquid soap.  You can apply chg directly  to the skin and wash                       Gently with a scrungie or clean washcloth.  5.  Apply  the CHG Soap to your body ONLY FROM THE NECK DOWN.   Do not use on face/ open                           Wound or open sores. Avoid contact with eyes, ears mouth and genitals (private parts).                       Wash face,  Genitals (private parts) with your normal soap.             6.  Wash thoroughly, paying special attention to the area where your surgery  will be performed.  7.  Thoroughly rinse your body with warm water from the neck down.  8.  DO NOT shower/wash with your normal soap after using and rinsing off  the CHG Soap.                9.  Pat yourself dry with a clean towel.            10.  Wear clean pajamas.            11.  Place clean sheets on your bed the night of your first shower and do not  sleep with pets. Day of Surgery : Do not apply any lotions/deodorants the morning of surgery.  Please wear clean clothes to the hospital/surgery center.  FAILURE TO FOLLOW THESE INSTRUCTIONS MAY RESULT IN THE CANCELLATION OF YOUR SURGERY PATIENT SIGNATURE_________________________________  NURSE SIGNATURE__________________________________  ________________________________________________________________________

## 2017-03-10 ENCOUNTER — Encounter (HOSPITAL_COMMUNITY)
Admission: RE | Admit: 2017-03-10 | Discharge: 2017-03-10 | Disposition: A | Discharge status: Home / Self Care | Payer: Medicaid Other | Source: Ambulatory Visit | Attending: General Surgery | Admitting: General Surgery

## 2017-03-17 ENCOUNTER — Ambulatory Visit (HOSPITAL_COMMUNITY): Admission: RE | Admit: 2017-03-17 | Payer: Medicaid Other | Source: Ambulatory Visit | Admitting: General Surgery

## 2017-03-17 ENCOUNTER — Encounter (HOSPITAL_COMMUNITY): Admission: RE | Payer: Self-pay | Source: Ambulatory Visit

## 2017-03-17 SURGERY — REPAIR, HERNIA, UMBILICAL, LAPAROSCOPIC
Anesthesia: General

## 2017-06-21 IMAGING — CR DG FINGER INDEX RIGHT
3 series · 3 of 3 positions shown · non-contrast
Comparison: None.

CLINICAL DATA: Pain and bruising of the right index finger

EXAM:
RIGHT INDEX FINGER 2+V

[x finger pa right]
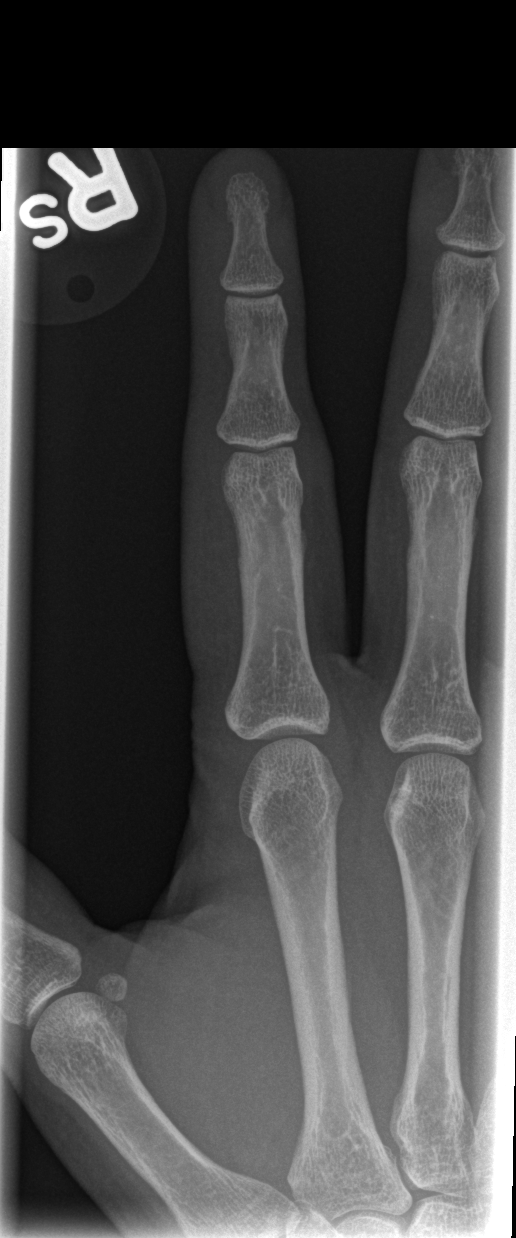

[x finger obl. right]
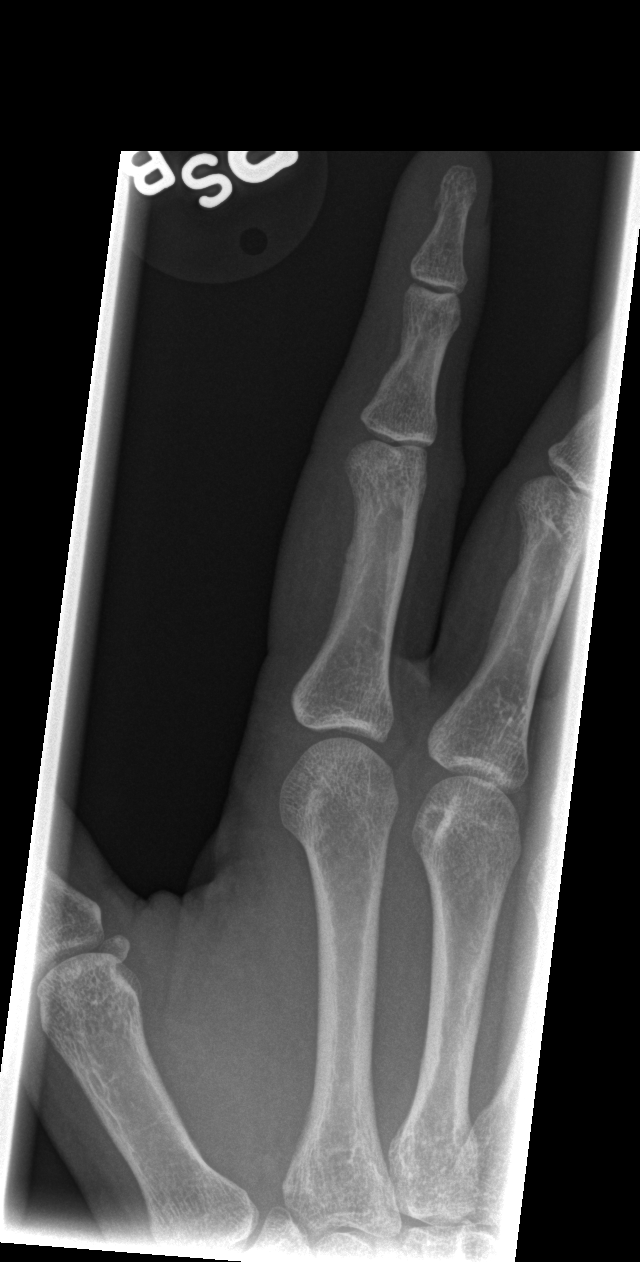

[x finger lateral right]
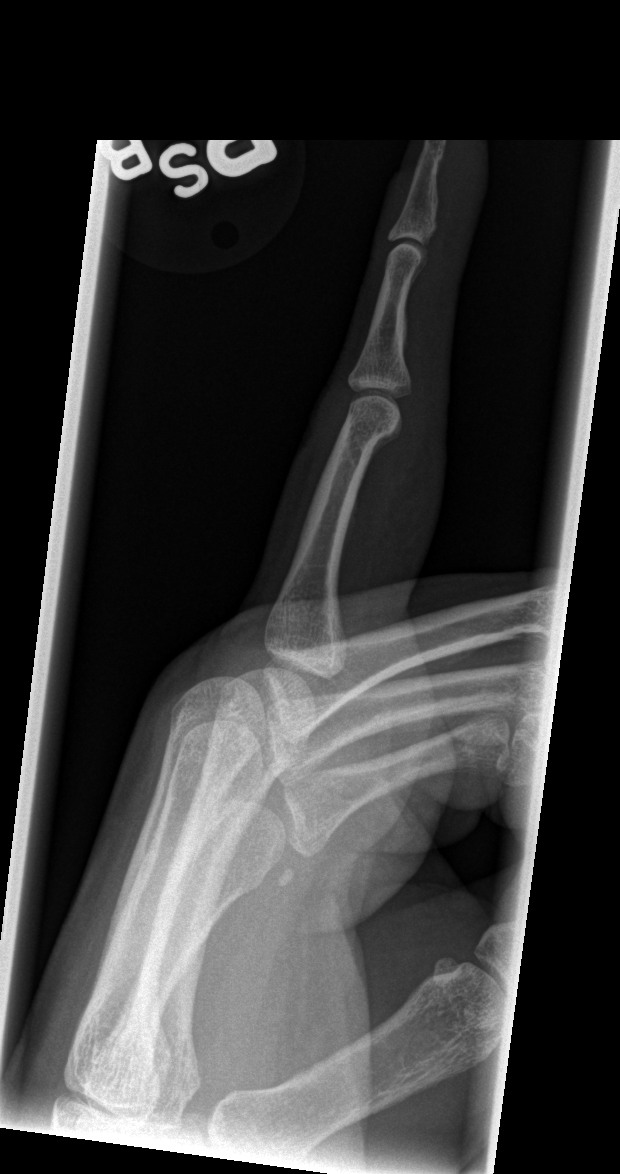

[3 of 3 positions shown; findings below may reference images not displayed]

FINDINGS: There is no evidence of fracture or dislocation. There is no
evidence of arthropathy or other focal bone abnormality. Soft
tissues are unremarkable.
IMPRESSION: Negative.

## 2017-10-20 HISTORY — PX: LAPAROSCOPIC GASTRIC SLEEVE RESECTION: SHX5895

## 2018-05-13 ENCOUNTER — Other Ambulatory Visit: Payer: Self-pay

## 2018-05-13 ENCOUNTER — Emergency Department (HOSPITAL_BASED_OUTPATIENT_CLINIC_OR_DEPARTMENT_OTHER): Payer: BLUE CROSS/BLUE SHIELD

## 2018-05-13 ENCOUNTER — Emergency Department (HOSPITAL_BASED_OUTPATIENT_CLINIC_OR_DEPARTMENT_OTHER)
Admission: EM | Admit: 2018-05-13 | Discharge: 2018-05-13 | Disposition: A | Discharge status: Home / Self Care | Payer: BLUE CROSS/BLUE SHIELD | Attending: Emergency Medicine | Admitting: Emergency Medicine

## 2018-05-13 ENCOUNTER — Encounter (HOSPITAL_BASED_OUTPATIENT_CLINIC_OR_DEPARTMENT_OTHER): Payer: Self-pay

## 2018-05-13 DIAGNOSIS — Z87891 Personal history of nicotine dependence: Secondary | ICD-10-CM | POA: Insufficient documentation

## 2018-05-13 DIAGNOSIS — J45909 Unspecified asthma, uncomplicated: Secondary | ICD-10-CM | POA: Insufficient documentation

## 2018-05-13 DIAGNOSIS — R0781 Pleurodynia: Secondary | ICD-10-CM | POA: Diagnosis not present

## 2018-05-13 DIAGNOSIS — E041 Nontoxic single thyroid nodule: Secondary | ICD-10-CM | POA: Insufficient documentation

## 2018-05-13 DIAGNOSIS — Z79899 Other long term (current) drug therapy: Secondary | ICD-10-CM | POA: Diagnosis not present

## 2018-05-13 DIAGNOSIS — D509 Iron deficiency anemia, unspecified: Secondary | ICD-10-CM | POA: Insufficient documentation

## 2018-05-13 DIAGNOSIS — R079 Chest pain, unspecified: Secondary | ICD-10-CM | POA: Diagnosis present

## 2018-05-13 LAB — COMPREHENSIVE METABOLIC PANEL
ALK PHOS: 68 U/L (ref 38–126)
ALT: 16 U/L (ref 0–44)
AST: 19 U/L (ref 15–41)
Albumin: 4.1 g/dL (ref 3.5–5.0)
Anion gap: 9 (ref 5–15)
BUN: 21 mg/dL — AB (ref 6–20)
CALCIUM: 9.5 mg/dL (ref 8.9–10.3)
CHLORIDE: 107 mmol/L (ref 98–111)
CO2: 22 mmol/L (ref 22–32)
CREATININE: 0.88 mg/dL (ref 0.44–1.00)
GFR calc non Af Amer: >60 mL/min (ref >60)
GLUCOSE: 76 mg/dL (ref 70–99)
Potassium: 3.6 mmol/L (ref 3.5–5.1)
SODIUM: 138 mmol/L (ref 135–145)
Total Bilirubin: 0.7 mg/dL (ref 0.3–1.2)
Total Protein: 7.8 g/dL (ref 6.5–8.1)

## 2018-05-13 LAB — CBC
HEMATOCRIT: 34.9 % — AB (ref 36.0–46.0)
HEMOGLOBIN: 9.8 g/dL — AB (ref 12.0–15.0)
MCH: 20.8 pg — ABNORMAL LOW (ref 26.0–34.0)
MCHC: 28.1 g/dL — ABNORMAL LOW (ref 30.0–36.0)
MCV: 73.9 fL — ABNORMAL LOW (ref 80.0–100.0)
Platelets: 386 10*3/uL (ref 150–400)
RBC: 4.72 MIL/uL (ref 3.87–5.11)
RDW: 17.6 % — ABNORMAL HIGH (ref 11.5–15.5)
WBC: 9.1 10*3/uL (ref 4.0–10.5)
nRBC: 0 % (ref 0.0–0.2)

## 2018-05-13 LAB — D-DIMER, QUANTITATIVE: D-Dimer, Quant: 1.21 ug/mL-FEU — ABNORMAL HIGH (ref 0.00–0.50)

## 2018-05-13 LAB — TROPONIN I

## 2018-05-13 MED ORDER — NAPROXEN 500 MG PO TABS: 500.0000 mg | ORAL_TABLET | Freq: Two times a day (BID) | ORAL | 0 refills | Status: AC

## 2018-05-13 MED ORDER — OXYCODONE-ACETAMINOPHEN 5-325 MG PO TABS: 1.0000 | ORAL_TABLET | ORAL | 0 refills | Status: AC | PRN

## 2018-05-13 MED ORDER — NAPROXEN 500 MG PO TABS: 500.0000 mg | ORAL_TABLET | Freq: Two times a day (BID) | ORAL | 0 refills | Status: DC

## 2018-05-13 MED ORDER — KETOROLAC TROMETHAMINE 30 MG/ML IJ SOLN
30.0000 mg | Freq: Once | INTRAMUSCULAR | Status: AC
  Administered 2018-05-13: 30 mg via INTRAVENOUS
  Filled 2018-05-13: qty 1

## 2018-05-13 MED ORDER — IOPAMIDOL (ISOVUE-370) INJECTION 76%
100.0000 mL | Freq: Once | INTRAVENOUS | Status: AC | PRN
  Administered 2018-05-13: 100 mL via INTRAVENOUS

## 2018-05-13 MED ORDER — DEXAMETHASONE SODIUM PHOSPHATE 10 MG/ML IJ SOLN
10.0000 mg | Freq: Once | INTRAMUSCULAR | Status: AC
  Administered 2018-05-13: 10 mg via INTRAVENOUS
  Filled 2018-05-13: qty 1
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
## 2018-05-13 NOTE — ED Notes (Signed)
PT in CT.

## 2018-05-13 NOTE — Discharge Instructions (Signed)
Your blood work and CT scan did not show any serious cause for your chest pain.  However, there was an incidental finding of a nodule in your thyroid.  Radiologist recommends outpatient thyroid ultrasound.  This can be arranged by your primary care provider.  Take naproxen twice a day, take oxycodone-acetaminophen if needed for severe pain, otherwise take acetaminophen for less severe pain.  Return if symptoms are getting worse.

## 2018-05-13 NOTE — ED Triage Notes (Signed)
Left side chest pain from shoulder down breast and around to back since Tuesday, the pain is worse with movement and breathing

## 2018-11-18 ENCOUNTER — Encounter (HOSPITAL_BASED_OUTPATIENT_CLINIC_OR_DEPARTMENT_OTHER): Payer: Self-pay | Admitting: Emergency Medicine

## 2018-11-18 ENCOUNTER — Other Ambulatory Visit: Payer: Self-pay

## 2018-11-18 ENCOUNTER — Emergency Department (HOSPITAL_BASED_OUTPATIENT_CLINIC_OR_DEPARTMENT_OTHER)
Admission: EM | Admit: 2018-11-18 | Discharge: 2018-11-19 | Disposition: A | Discharge status: Home / Self Care | Payer: BLUE CROSS/BLUE SHIELD | Attending: Emergency Medicine | Admitting: Emergency Medicine

## 2018-11-18 DIAGNOSIS — G43809 Other migraine, not intractable, without status migrainosus: Secondary | ICD-10-CM | POA: Diagnosis not present

## 2018-11-18 DIAGNOSIS — R51 Headache: Secondary | ICD-10-CM | POA: Diagnosis present

## 2018-11-18 DIAGNOSIS — Z87891 Personal history of nicotine dependence: Secondary | ICD-10-CM | POA: Insufficient documentation

## 2018-11-18 DIAGNOSIS — Z79899 Other long term (current) drug therapy: Secondary | ICD-10-CM | POA: Insufficient documentation

## 2018-11-18 LAB — PREGNANCY, URINE: Preg Test, Ur: NEGATIVE

## 2018-11-18 MED ORDER — KETOROLAC TROMETHAMINE 30 MG/ML IJ SOLN
15.0000 mg | Freq: Once | INTRAMUSCULAR | Status: AC
  Administered 2018-11-19: 15 mg via INTRAVENOUS
  Filled 2018-11-18: qty 1

## 2018-11-18 MED ORDER — METOCLOPRAMIDE HCL 5 MG/ML IJ SOLN
10.0000 mg | Freq: Once | INTRAMUSCULAR | Status: AC
  Administered 2018-11-19: 10 mg via INTRAVENOUS
  Filled 2018-11-18: qty 2

## 2018-11-18 MED ORDER — DIPHENHYDRAMINE HCL 50 MG/ML IJ SOLN
12.5000 mg | Freq: Once | INTRAMUSCULAR | Status: AC
  Administered 2018-11-19: 12.5 mg via INTRAVENOUS
  Filled 2018-11-18: qty 1

## 2018-11-18 MED ORDER — MAGNESIUM SULFATE 2 GM/50ML IV SOLN
2.0000 g | Freq: Once | INTRAVENOUS | Status: AC
  Administered 2018-11-19: 2 g via INTRAVENOUS
  Filled 2018-11-18: qty 50

## 2018-11-18 MED ORDER — DIPHENHYDRAMINE HCL 50 MG/ML IJ SOLN: 25.0000 mg | Freq: Once | INTRAMUSCULAR | Status: DC

## 2018-11-18 MED ORDER — VALPROIC ACID 250 MG PO CAPS
500.0000 mg | ORAL_CAPSULE | ORAL | Status: DC
  Filled 2018-11-18: qty 2

## 2018-11-18 NOTE — ED Triage Notes (Signed)
Pt states she has a migraine headache that started about 48 hours ago  Pt states she has light headedness, dizziness, and nausea
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
## 2019-04-16 ENCOUNTER — Emergency Department (HOSPITAL_BASED_OUTPATIENT_CLINIC_OR_DEPARTMENT_OTHER)
Admission: EM | Admit: 2019-04-16 | Discharge: 2019-04-16 | Disposition: A | Discharge status: Other Acute Inpatient Hospital | Payer: BLUE CROSS/BLUE SHIELD | Attending: Emergency Medicine | Admitting: Emergency Medicine

## 2019-04-16 ENCOUNTER — Encounter (HOSPITAL_BASED_OUTPATIENT_CLINIC_OR_DEPARTMENT_OTHER): Payer: Self-pay | Admitting: Emergency Medicine

## 2019-04-16 ENCOUNTER — Other Ambulatory Visit: Payer: Self-pay

## 2019-04-16 ENCOUNTER — Emergency Department (HOSPITAL_BASED_OUTPATIENT_CLINIC_OR_DEPARTMENT_OTHER): Payer: BLUE CROSS/BLUE SHIELD

## 2019-04-16 DIAGNOSIS — R102 Pelvic and perineal pain: Secondary | ICD-10-CM

## 2019-04-16 DIAGNOSIS — R1031 Right lower quadrant pain: Secondary | ICD-10-CM | POA: Diagnosis present

## 2019-04-16 DIAGNOSIS — Z3202 Encounter for pregnancy test, result negative: Secondary | ICD-10-CM | POA: Insufficient documentation

## 2019-04-16 DIAGNOSIS — J45909 Unspecified asthma, uncomplicated: Secondary | ICD-10-CM | POA: Insufficient documentation

## 2019-04-16 DIAGNOSIS — N83519 Torsion of ovary and ovarian pedicle, unspecified side: Secondary | ICD-10-CM

## 2019-04-16 DIAGNOSIS — Z20828 Contact with and (suspected) exposure to other viral communicable diseases: Secondary | ICD-10-CM | POA: Insufficient documentation

## 2019-04-16 DIAGNOSIS — Z87891 Personal history of nicotine dependence: Secondary | ICD-10-CM | POA: Diagnosis not present

## 2019-04-16 LAB — CBC WITH DIFFERENTIAL/PLATELET
Abs Immature Granulocytes: 0.01 10*3/uL (ref 0.00–0.07)
Basophils Absolute: 0 10*3/uL (ref 0.0–0.1)
Basophils Relative: 1 %
Eosinophils Absolute: 0.2 10*3/uL (ref 0.0–0.5)
Eosinophils Relative: 2 %
HCT: 37.4 % (ref 36.0–46.0)
Hemoglobin: 10.8 g/dL — ABNORMAL LOW (ref 12.0–15.0)
Immature Granulocytes: 0 %
Lymphocytes Relative: 43 %
Lymphs Abs: 3.6 10*3/uL (ref 0.7–4.0)
MCH: 22.3 pg — ABNORMAL LOW (ref 26.0–34.0)
MCHC: 28.9 g/dL — ABNORMAL LOW (ref 30.0–36.0)
MCV: 77.1 fL — ABNORMAL LOW (ref 80.0–100.0)
Monocytes Absolute: 0.5 10*3/uL (ref 0.1–1.0)
Monocytes Relative: 6 %
Neutro Abs: 4.2 10*3/uL (ref 1.7–7.7)
Neutrophils Relative %: 48 %
Platelets: 383 10*3/uL (ref 150–400)
RBC: 4.85 MIL/uL (ref 3.87–5.11)
RDW: 17.1 % — ABNORMAL HIGH (ref 11.5–15.5)
WBC: 8.5 10*3/uL (ref 4.0–10.5)
nRBC: 0 % (ref 0.0–0.2)

## 2019-04-16 LAB — COMPREHENSIVE METABOLIC PANEL
ALT: 12 U/L (ref 0–44)
AST: 14 U/L — ABNORMAL LOW (ref 15–41)
Albumin: 3.8 g/dL (ref 3.5–5.0)
Alkaline Phosphatase: 69 U/L (ref 38–126)
Anion gap: 7 (ref 5–15)
BUN: 14 mg/dL (ref 6–20)
CO2: 23 mmol/L (ref 22–32)
Calcium: 8.9 mg/dL (ref 8.9–10.3)
Chloride: 106 mmol/L (ref 98–111)
Creatinine, Ser: 0.64 mg/dL (ref 0.44–1.00)
GFR calc Af Amer: >60 mL/min (ref >60)
GFR calc non Af Amer: >60 mL/min (ref >60)
Glucose, Bld: 86 mg/dL (ref 70–99)
Potassium: 3.5 mmol/L (ref 3.5–5.1)
Sodium: 136 mmol/L (ref 135–145)
Total Bilirubin: 0.5 mg/dL (ref 0.3–1.2)
Total Protein: 7.1 g/dL (ref 6.5–8.1)

## 2019-04-16 LAB — URINALYSIS, ROUTINE W REFLEX MICROSCOPIC
Bilirubin Urine: NEGATIVE
Glucose, UA: NEGATIVE mg/dL
Hgb urine dipstick: NEGATIVE
Ketones, ur: NEGATIVE mg/dL
Leukocytes,Ua: NEGATIVE
Nitrite: NEGATIVE
Protein, ur: NEGATIVE mg/dL
Specific Gravity, Urine: >1.03 — ABNORMAL HIGH (ref 1.005–1.030)
pH: 5.5 (ref 5.0–8.0)

## 2019-04-16 LAB — SARS CORONAVIRUS 2 BY RT PCR (HOSPITAL ORDER, PERFORMED IN ~~LOC~~ HOSPITAL LAB): SARS Coronavirus 2: NEGATIVE

## 2019-04-16 LAB — LIPASE, BLOOD: Lipase: 38 U/L (ref 11–51)

## 2019-04-16 LAB — PREGNANCY, URINE: Preg Test, Ur: NEGATIVE

## 2019-04-16 MED ORDER — KETOROLAC TROMETHAMINE 30 MG/ML IJ SOLN
30.0000 mg | Freq: Once | INTRAMUSCULAR | Status: AC
  Administered 2019-04-16: 30 mg via INTRAVENOUS
  Filled 2019-04-16: qty 1

## 2019-04-16 MED ORDER — MORPHINE SULFATE (PF) 4 MG/ML IV SOLN
4.0000 mg | Freq: Once | INTRAVENOUS | Status: AC
  Administered 2019-04-16: 4 mg via INTRAVENOUS
  Filled 2019-04-16: qty 1
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
## 2019-04-16 NOTE — ED Notes (Signed)
[facility] for OB/GYN - [phone])

## 2019-04-16 NOTE — ED Notes (Signed)
Contacted [facility]) - patient ready for transport.  [facility] on the way

## 2019-04-16 NOTE — ED Triage Notes (Signed)
RLQ pain x 3 weeks. Denies N/V/D.

## 2019-04-16 NOTE — ED Notes (Signed)
Waiting for patient to come back from [place] to start IV, draw blood, and given ordered medication.

## 2019-04-16 NOTE — ED Notes (Signed)
Taken to [place].

## 2019-04-18 MED FILL — Ondansetron HCl Inj 4 MG/2ML (2 MG/ML): INTRAMUSCULAR | Qty: 2 | Status: AC

## 2020-03-14 ENCOUNTER — Ambulatory Visit: Payer: BLUE CROSS/BLUE SHIELD

## 2020-03-15 ENCOUNTER — Ambulatory Visit: Payer: BLUE CROSS/BLUE SHIELD | Attending: Internal Medicine

## 2020-03-15 DIAGNOSIS — Z23 Encounter for immunization: Secondary | ICD-10-CM

## 2020-03-15 NOTE — Progress Notes (Signed)
   Covid-19 Vaccination Clinic  Name:  Sarah Franklin    MRN: [id number] DOB: [birth year]  03/15/2020  [name] was observed post Covid-19 immunization for 15 minutes without incident. She was provided with Vaccine Information Sheet and instruction to access the V-Safe system. Vaccinated by [facility].  [name] was instructed to call 911 with any severe reactions post vaccine: Marland Kitchen Difficulty breathing  . Swelling of face and throat  . A fast heartbeat  . A bad rash all over body  . Dizziness and weakness   Immunizations Administered    Name Date Dose VIS Date Route   JANSSEN COVID-19 VACCINE 03/15/2020  9:47 AM 0.5 mL 08/19/2019 Intramuscular   Manufacturer: [employer]   Lot: [id number]   [place]: [phone]
